# Patient Record
Sex: Female | Born: 1990
Health system: Southern US, Community
[De-identification: ages and names within clinical notes are randomized; demographics above are authoritative.]

## PROBLEM LIST (undated history)

## (undated) DIAGNOSIS — F32A Depression, unspecified: Secondary | ICD-10-CM

## (undated) DIAGNOSIS — B20 Human immunodeficiency virus [HIV] disease: Secondary | ICD-10-CM

## (undated) DIAGNOSIS — B1910 Unspecified viral hepatitis B without hepatic coma: Secondary | ICD-10-CM

## (undated) DIAGNOSIS — B1920 Unspecified viral hepatitis C without hepatic coma: Secondary | ICD-10-CM

## (undated) DIAGNOSIS — Z21 Asymptomatic human immunodeficiency virus [HIV] infection status: Secondary | ICD-10-CM

---

## 2019-06-17 ENCOUNTER — Observation Stay (HOSPITAL_COMMUNITY): Payer: Self-pay

## 2019-06-17 ENCOUNTER — Emergency Department (HOSPITAL_COMMUNITY): Payer: Self-pay

## 2019-06-17 ENCOUNTER — Encounter (HOSPITAL_COMMUNITY): Payer: Self-pay | Admitting: Emergency Medicine

## 2019-06-17 ENCOUNTER — Inpatient Hospital Stay (HOSPITAL_COMMUNITY)
Admission: EM | Admit: 2019-06-17 | Discharge: 2019-06-20 | DRG: 917 | Disposition: A | Discharge status: Home / Self Care | Payer: Self-pay | Attending: Internal Medicine | Admitting: Internal Medicine

## 2019-06-17 DIAGNOSIS — T43621A Poisoning by amphetamines, accidental (unintentional), initial encounter: Principal | ICD-10-CM | POA: Diagnosis present

## 2019-06-17 DIAGNOSIS — F1310 Sedative, hypnotic or anxiolytic abuse, uncomplicated: Secondary | ICD-10-CM | POA: Diagnosis present

## 2019-06-17 DIAGNOSIS — T50901A Poisoning by unspecified drugs, medicaments and biological substances, accidental (unintentional), initial encounter: Secondary | ICD-10-CM | POA: Diagnosis present

## 2019-06-17 DIAGNOSIS — Z21 Asymptomatic human immunodeficiency virus [HIV] infection status: Secondary | ICD-10-CM | POA: Diagnosis present

## 2019-06-17 DIAGNOSIS — Z885 Allergy status to narcotic agent status: Secondary | ICD-10-CM

## 2019-06-17 DIAGNOSIS — Z882 Allergy status to sulfonamides status: Secondary | ICD-10-CM

## 2019-06-17 DIAGNOSIS — F1910 Other psychoactive substance abuse, uncomplicated: Secondary | ICD-10-CM | POA: Diagnosis present

## 2019-06-17 DIAGNOSIS — B1920 Unspecified viral hepatitis C without hepatic coma: Secondary | ICD-10-CM | POA: Diagnosis present

## 2019-06-17 DIAGNOSIS — F419 Anxiety disorder, unspecified: Secondary | ICD-10-CM | POA: Diagnosis present

## 2019-06-17 DIAGNOSIS — R4182 Altered mental status, unspecified: Secondary | ICD-10-CM

## 2019-06-17 DIAGNOSIS — Z20822 Contact with and (suspected) exposure to covid-19: Secondary | ICD-10-CM | POA: Diagnosis present

## 2019-06-17 DIAGNOSIS — F1510 Other stimulant abuse, uncomplicated: Secondary | ICD-10-CM | POA: Diagnosis present

## 2019-06-17 DIAGNOSIS — R011 Cardiac murmur, unspecified: Secondary | ICD-10-CM

## 2019-06-17 DIAGNOSIS — G92 Toxic encephalopathy: Secondary | ICD-10-CM

## 2019-06-17 DIAGNOSIS — R509 Fever, unspecified: Secondary | ICD-10-CM | POA: Diagnosis present

## 2019-06-17 DIAGNOSIS — I38 Endocarditis, valve unspecified: Secondary | ICD-10-CM

## 2019-06-17 DIAGNOSIS — B20 Human immunodeficiency virus [HIV] disease: Secondary | ICD-10-CM | POA: Diagnosis present

## 2019-06-17 DIAGNOSIS — R6510 Systemic inflammatory response syndrome (SIRS) of non-infectious origin without acute organ dysfunction: Secondary | ICD-10-CM

## 2019-06-17 DIAGNOSIS — E876 Hypokalemia: Secondary | ICD-10-CM

## 2019-06-17 DIAGNOSIS — G928 Other toxic encephalopathy: Secondary | ICD-10-CM | POA: Diagnosis present

## 2019-06-17 DIAGNOSIS — B1910 Unspecified viral hepatitis B without hepatic coma: Secondary | ICD-10-CM

## 2019-06-17 LAB — CBC
HCT: 34.9 % — ABNORMAL LOW (ref 36.0–46.0)
Hemoglobin: 10.3 g/dL — ABNORMAL LOW (ref 12.0–15.0)
MCH: 22.9 pg — ABNORMAL LOW (ref 26.0–34.0)
MCHC: 29.5 g/dL — ABNORMAL LOW (ref 30.0–36.0)
MCV: 77.6 fL — ABNORMAL LOW (ref 80.0–100.0)
Platelets: 249 10*3/uL (ref 150–400)
RBC: 4.5 MIL/uL (ref 3.87–5.11)
RDW: 16.9 % — ABNORMAL HIGH (ref 11.5–15.5)
WBC: 6.3 10*3/uL (ref 4.0–10.5)
nRBC: 0 % (ref 0.0–0.2)

## 2019-06-17 LAB — CBC WITH DIFFERENTIAL/PLATELET
Abs Immature Granulocytes: 0.04 K/uL (ref 0.00–0.07)
Basophils Absolute: 0 K/uL (ref 0.0–0.1)
Basophils Relative: 0 %
Eosinophils Absolute: 0 K/uL (ref 0.0–0.5)
Eosinophils Relative: 0 %
HCT: 37.1 % (ref 36.0–46.0)
Hemoglobin: 11.1 g/dL — ABNORMAL LOW (ref 12.0–15.0)
Immature Granulocytes: 0 %
Lymphocytes Relative: 9 %
Lymphs Abs: 0.8 K/uL (ref 0.7–4.0)
MCH: 23 pg — ABNORMAL LOW (ref 26.0–34.0)
MCHC: 29.9 g/dL — ABNORMAL LOW (ref 30.0–36.0)
MCV: 76.8 fL — ABNORMAL LOW (ref 80.0–100.0)
Monocytes Absolute: 0.6 K/uL (ref 0.1–1.0)
Monocytes Relative: 6 %
Neutro Abs: 7.6 K/uL (ref 1.7–7.7)
Neutrophils Relative %: 85 %
Platelets: 305 K/uL (ref 150–400)
RBC: 4.83 MIL/uL (ref 3.87–5.11)
RDW: 16.6 % — ABNORMAL HIGH (ref 11.5–15.5)
WBC: 9.1 K/uL (ref 4.0–10.5)
nRBC: 0 % (ref 0.0–0.2)

## 2019-06-17 LAB — I-STAT BETA HCG BLOOD, ED (MC, WL, AP ONLY): I-stat hCG, quantitative: <5 m[IU]/mL (ref <5)

## 2019-06-17 LAB — URINALYSIS, ROUTINE W REFLEX MICROSCOPIC
Bilirubin Urine: NEGATIVE
Glucose, UA: NEGATIVE mg/dL
Ketones, ur: 5 mg/dL — AB
Leukocytes,Ua: NEGATIVE
Nitrite: NEGATIVE
Protein, ur: NEGATIVE mg/dL
Specific Gravity, Urine: 1.006 (ref 1.005–1.030)
pH: 6 (ref 5.0–8.0)

## 2019-06-17 LAB — COMPREHENSIVE METABOLIC PANEL WITH GFR
ALT: 28 U/L (ref 0–44)
AST: 37 U/L (ref 15–41)
Albumin: 3.8 g/dL (ref 3.5–5.0)
Alkaline Phosphatase: 43 U/L (ref 38–126)
Anion gap: 13 (ref 5–15)
BUN: 12 mg/dL (ref 6–20)
CO2: 22 mmol/L (ref 22–32)
Calcium: 8.7 mg/dL — ABNORMAL LOW (ref 8.9–10.3)
Chloride: 103 mmol/L (ref 98–111)
Creatinine, Ser: 1.02 mg/dL — ABNORMAL HIGH (ref 0.44–1.00)
GFR calc Af Amer: 38 mL/min — ABNORMAL LOW
GFR calc non Af Amer: 33 mL/min — ABNORMAL LOW
Glucose, Bld: 99 mg/dL (ref 70–99)
Potassium: 2.9 mmol/L — ABNORMAL LOW (ref 3.5–5.1)
Sodium: 138 mmol/L (ref 135–145)
Total Bilirubin: 1 mg/dL (ref 0.3–1.2)
Total Protein: 8.1 g/dL (ref 6.5–8.1)

## 2019-06-17 LAB — GLUCOSE, CSF: Glucose, CSF: 73 mg/dL — ABNORMAL HIGH (ref 40–70)

## 2019-06-17 LAB — ETHANOL: Alcohol, Ethyl (B): <10 mg/dL

## 2019-06-17 LAB — CREATININE, SERUM
Creatinine, Ser: 0.69 mg/dL (ref 0.44–1.00)
GFR calc Af Amer: >60 mL/min (ref >60)
GFR calc non Af Amer: 52 mL/min — ABNORMAL LOW (ref >60)

## 2019-06-17 LAB — ECHOCARDIOGRAM COMPLETE
Height: 62 in
Weight: 2116.42 [oz_av]

## 2019-06-17 LAB — CSF CELL COUNT WITH DIFFERENTIAL
RBC Count, CSF: 7 /mm3 — ABNORMAL HIGH
Tube #: 1
WBC, CSF: 4 /mm3 (ref 0–5)

## 2019-06-17 LAB — MAGNESIUM: Magnesium: 1.8 mg/dL (ref 1.7–2.4)

## 2019-06-17 LAB — RAPID URINE DRUG SCREEN, HOSP PERFORMED
Amphetamines: POSITIVE — AB
Barbiturates: NOT DETECTED
Benzodiazepines: POSITIVE — AB
Cocaine: NOT DETECTED
Opiates: NOT DETECTED
Tetrahydrocannabinol: NOT DETECTED

## 2019-06-17 LAB — PROTEIN, CSF: Total  Protein, CSF: 35 mg/dL (ref 15–45)

## 2019-06-17 LAB — SARS CORONAVIRUS 2 (TAT 6-24 HRS): SARS Coronavirus 2: NEGATIVE

## 2019-06-17 LAB — LACTIC ACID, PLASMA
Lactic Acid, Venous: 1.4 mmol/L (ref 0.5–1.9)
Lactic Acid, Venous: 1.2 mmol/L (ref 0.5–1.9)

## 2019-06-17 LAB — APTT: aPTT: 36 seconds (ref 24–36)

## 2019-06-17 LAB — TSH: TSH: 0.395 u[IU]/mL (ref 0.350–4.500)

## 2019-06-17 LAB — CK: Total CK: 460 U/L — ABNORMAL HIGH (ref 38–234)

## 2019-06-17 LAB — RAPID HIV SCREEN (HIV 1/2 AB+AG)
HIV 1/2 Antibodies: REACTIVE — AB
HIV-1 P24 Antigen - HIV24: NONREACTIVE

## 2019-06-17 LAB — VITAMIN B12: Vitamin B-12: 157 pg/mL — ABNORMAL LOW (ref 180–914)

## 2019-06-17 LAB — PROTIME-INR
INR: 1.3 — ABNORMAL HIGH (ref 0.8–1.2)
Prothrombin Time: 16 seconds — ABNORMAL HIGH (ref 11.4–15.2)

## 2019-06-17 MED ORDER — SODIUM CHLORIDE 0.9 % IV SOLN: 2.0000 g | Freq: Three times a day (TID) | INTRAVENOUS | Status: DC

## 2019-06-17 MED ORDER — ONDANSETRON HCL 4 MG PO TABS: 4.0000 mg | ORAL_TABLET | Freq: Four times a day (QID) | ORAL | Status: DC | PRN

## 2019-06-17 MED ORDER — KETAMINE HCL 10 MG/ML IJ SOLN
INTRAMUSCULAR | Status: AC | PRN
  Administered 2019-06-17: 60 mg via INTRAVENOUS

## 2019-06-17 MED ORDER — ONDANSETRON HCL 4 MG/2ML IJ SOLN
4.0000 mg | Freq: Four times a day (QID) | INTRAMUSCULAR | Status: DC | PRN
  Administered 2019-06-18 – 2019-06-19 (×2): 4 mg via INTRAVENOUS
  Filled 2019-06-17 (×2): qty 2

## 2019-06-17 MED ORDER — METRONIDAZOLE IN NACL 5-0.79 MG/ML-% IV SOLN
500.0000 mg | Freq: Once | INTRAVENOUS | Status: AC
  Administered 2019-06-17: 500 mg via INTRAVENOUS
  Filled 2019-06-17: qty 100

## 2019-06-17 MED ORDER — LIDOCAINE HCL 2 % IJ SOLN
20.0000 mL | Freq: Once | INTRAMUSCULAR | Status: AC
  Administered 2019-06-17: 400 mg
  Filled 2019-06-17: qty 20

## 2019-06-17 MED ORDER — LACTATED RINGERS IV BOLUS (SEPSIS)
1000.0000 mL | Freq: Once | INTRAVENOUS | Status: AC
  Administered 2019-06-17: 1000 mL via INTRAVENOUS

## 2019-06-17 MED ORDER — SODIUM CHLORIDE 0.9 % IV SOLN
2.0000 g | Freq: Once | INTRAVENOUS | Status: AC
  Administered 2019-06-17: 05:00:00 2 g via INTRAVENOUS
  Filled 2019-06-17: qty 2

## 2019-06-17 MED ORDER — ENOXAPARIN SODIUM 40 MG/0.4ML ~~LOC~~ SOLN
40.0000 mg | Freq: Every day | SUBCUTANEOUS | Status: DC
  Administered 2019-06-17 – 2019-06-19 (×3): 40 mg via SUBCUTANEOUS
  Filled 2019-06-17 (×4): qty 0.4

## 2019-06-17 MED ORDER — SODIUM CHLORIDE 0.9 % IV SOLN: 1.0000 mg | Freq: Once | INTRAVENOUS | Status: DC

## 2019-06-17 MED ORDER — VANCOMYCIN HCL IN DEXTROSE 1-5 GM/200ML-% IV SOLN
1000.0000 mg | Freq: Once | INTRAVENOUS | Status: AC
  Administered 2019-06-17: 1000 mg via INTRAVENOUS
  Filled 2019-06-17: qty 200

## 2019-06-17 MED ORDER — PROPOFOL 10 MG/ML IV BOLUS
INTRAVENOUS | Status: AC | PRN
  Administered 2019-06-17: 30 mg via INTRAVENOUS

## 2019-06-17 MED ORDER — MAGNESIUM SULFATE 2 GM/50ML IV SOLN
2.0000 g | Freq: Once | INTRAVENOUS | Status: AC
  Administered 2019-06-17: 2 g via INTRAVENOUS
  Filled 2019-06-17: qty 50

## 2019-06-17 MED ORDER — ACETAMINOPHEN 325 MG PO TABS: 650.0000 mg | ORAL_TABLET | Freq: Four times a day (QID) | ORAL | Status: DC | PRN

## 2019-06-17 MED ORDER — POTASSIUM CHLORIDE IN NACL 40-0.9 MEQ/L-% IV SOLN
INTRAVENOUS | Status: DC
  Administered 2019-06-17 – 2019-06-18 (×2): 125 mL/h via INTRAVENOUS
  Filled 2019-06-17 (×4): qty 1000

## 2019-06-17 MED ORDER — THIAMINE HCL 100 MG/ML IJ SOLN
100.0000 mg | Freq: Once | INTRAMUSCULAR | Status: AC
  Administered 2019-06-17: 100 mg via INTRAVENOUS

## 2019-06-17 MED ORDER — KETAMINE HCL 50 MG/5ML IJ SOSY
1.0000 mg/kg | PREFILLED_SYRINGE | Freq: Once | INTRAMUSCULAR | Status: AC
  Administered 2019-06-17: 60 mg via INTRAVENOUS
  Filled 2019-06-17: qty 10

## 2019-06-17 MED ORDER — LACTATED RINGERS IV SOLN: INTRAVENOUS | Status: DC

## 2019-06-17 MED ORDER — ZIPRASIDONE MESYLATE 20 MG IM SOLR
20.0000 mg | Freq: Once | INTRAMUSCULAR | Status: AC
  Administered 2019-06-17: 20 mg via INTRAMUSCULAR
  Filled 2019-06-17: qty 20

## 2019-06-17 MED ORDER — SODIUM CHLORIDE 0.9 % IV SOLN
2.0000 g | Freq: Two times a day (BID) | INTRAVENOUS | Status: DC
  Administered 2019-06-17 – 2019-06-19 (×5): 2 g via INTRAVENOUS
  Filled 2019-06-17: qty 2
  Filled 2019-06-17 (×4): qty 20
  Filled 2019-06-17: qty 2
  Filled 2019-06-17: qty 20

## 2019-06-17 MED ORDER — FOLIC ACID 5 MG/ML IJ SOLN
1.0000 mg | Freq: Every day | INTRAMUSCULAR | Status: DC
  Administered 2019-06-17 – 2019-06-20 (×4): 1 mg via INTRAVENOUS
  Filled 2019-06-17 (×5): qty 0.2

## 2019-06-17 MED ORDER — PROPOFOL 10 MG/ML IV BOLUS
0.5000 mg/kg | Freq: Once | INTRAVENOUS | Status: AC
  Administered 2019-06-17: 30 mg via INTRAVENOUS
  Filled 2019-06-17: qty 20

## 2019-06-17 MED ORDER — VANCOMYCIN HCL IN DEXTROSE 750-5 MG/150ML-% IV SOLN
750.0000 mg | Freq: Two times a day (BID) | INTRAVENOUS | Status: DC
  Administered 2019-06-18 – 2019-06-19 (×3): 750 mg via INTRAVENOUS
  Filled 2019-06-17 (×4): qty 150

## 2019-06-17 MED ORDER — ACETAMINOPHEN 650 MG RE SUPP: 650.0000 mg | Freq: Four times a day (QID) | RECTAL | Status: DC | PRN

## 2019-06-17 MED ORDER — VANCOMYCIN HCL IN DEXTROSE 1-5 GM/200ML-% IV SOLN: 1000.0000 mg | INTRAVENOUS | Status: DC

## 2019-06-17 NOTE — Progress Notes (Signed)
Pharmacy Antibiotic Note  Cindy Robertson is a 29 y.o. female admitted on 06/17/2019 with rule out sepsis/meningitis. Pharmacy has been consulted for Vancomycin dosing. Also on ceftriaxone per MD. S/p LP - findings not consistent with meningitis. SCr improved, 1.02>>0.69.  Plan: Change vancomycin to 750mg  IV q12h Ceftriaxone 2g IV q12h per MD - f/u reduce dose if meningitis r/o Monitor clinical progress, c/s, renal function F/u de-escalation plan/LOT, vancomycin levels as indicated    Height: 5\' 2"  (157.5 cm) Weight: 60 kg (132 lb 4.4 oz) IBW/kg (Calculated) : 50.1  Temp (24hrs), Avg:101.4 F (38.6 C), Min:101.4 F (38.6 C), Max:101.4 F (38.6 C)  Recent Labs  Lab 06/17/19 0408 06/17/19 0516 06/17/19 0917  WBC 9.1  --  6.3  CREATININE 1.02*  --  0.69  LATICACIDVEN 1.4 1.2  --     Estimated Creatinine Clearance: 76.2 mL/min (by C-G formula based on SCr of 0.69 mg/dL).    Not on File   06/19/19, PharmD, BCPS Please check AMION for all [facility] contact numbers Clinical Pharmacist 06/17/2019 11:22 AM

## 2019-06-17 NOTE — ED Notes (Signed)
Unable to perform portable chest x-ray at this time due to patient's agitation /restless and movement , attempted multiple times to access peripheral IV and collect blood specimen but failed due to patients movement /uncooperative/agitation and restlessness, EDP notified .

## 2019-06-17 NOTE — Progress Notes (Signed)
Pharmacy Antibiotic Note  Cindy Robertson is a 29 y.o. female admitted on 06/17/2019 with rule out sepsis.  Pharmacy has been consulted for Vancomycin/Cefepime dosing. WBC WNL. Renal function OK.   Plan: Vancomycin 1000 mg IV q24h >>Estimated AUC: 441 Cefepime 2g IV q8h Trend WBC, temp, renal function  F/U infectious work-up Drug levels as indicated   Height: 5\' 2"  (157.5 cm) Weight: 60 kg (132 lb 4.4 oz) IBW/kg (Calculated) : 50.1  Temp (24hrs), Avg:101.4 F (38.6 C), Min:101.4 F (38.6 C), Max:101.4 F (38.6 C)  Recent Labs  Lab 06/17/19 0408  WBC 9.1  CREATININE 1.02*  LATICACIDVEN 1.4    Estimated Creatinine Clearance: -2.3 mL/min (A) (by C-G formula based on SCr of 1.02 mg/dL (H)).    Not on File  06/19/19, PharmD, BCPS Clinical Pharmacist Phone: [phone]

## 2019-06-17 NOTE — ED Provider Notes (Signed)
[facility] EMERGENCY DEPARTMENT Provider Note   CSN: [id number] Arrival date & time: 06/17/19  6546     History Chief Complaint  Patient presents with  . Drug Problem    Cindy Robertson is a 29 y.o. female.  Patient brought to the emergency department by ambulance from a local hotel.  Police were called because the patient was extremely agitated and acting erratically.  EMS report that the patient was extremely agitated, diaphoretic, not redirectable.  They were unable to start an IV, administered Versed 5 mg IM in order to calm her down.  Before she was sedated she admitted to meth use but says she has not had any in 1 to 2 days.        History reviewed. No pertinent past medical history.  There are no problems to display for this patient.   History reviewed. No pertinent surgical history.   OB History   No obstetric history on file.     No family history on file.  Social History   Tobacco Use  . Smoking status: Unknown If Ever Smoked  Substance Use Topics  . Alcohol use: Not on file  . Drug use: Not on file    Home Medications Prior to Admission medications   Not on File    Allergies    Patient has no allergy information on record.  Review of Systems   Review of Systems  Unable to perform ROS: Mental status change    Physical Exam Updated Vital Signs BP 135/77   Pulse 92   Temp (!) 101.4 F (38.6 C) (Rectal)   Resp 14   Ht 5\' 2"  (1.575 m)   Wt 60 kg   SpO2 100%   BMI 24.19 kg/m   Physical Exam Vitals and nursing note reviewed.  Constitutional:      General: She is not in acute distress.    Appearance: Normal appearance. She is well-developed.  HENT:     Head: Normocephalic and atraumatic.     Right Ear: Hearing normal.     Left Ear: Hearing normal.     Nose: Nose normal.  Eyes:     Conjunctiva/sclera: Conjunctivae normal.     Pupils: Pupils are equal, round, and reactive to light.  Cardiovascular:     Rate and  Rhythm: Regular rhythm.     Heart sounds: S1 normal and S2 normal. No murmur. No friction rub. No gallop.   Pulmonary:     Effort: Pulmonary effort is normal. No respiratory distress.     Breath sounds: Normal breath sounds.  Chest:     Chest wall: No tenderness.  Abdominal:     General: Bowel sounds are normal.     Palpations: Abdomen is soft.     Tenderness: There is no abdominal tenderness. There is no guarding or rebound. Negative signs include Murphy's sign and McBurney's sign.     Hernia: No hernia is present.  Musculoskeletal:        General: Normal range of motion.     Cervical back: Normal range of motion and neck supple.  Skin:    General: Skin is warm and dry.     Findings: No rash.     Comments: Multiple scabs on face and neck, across abdomen  Neurological:     Mental Status: She is alert. She is disoriented.     GCS: GCS eye subscore is 4. GCS verbal subscore is 5. GCS motor subscore is 6.  Cranial Nerves: No cranial nerve deficit.     Sensory: No sensory deficit.     Coordination: Coordination normal.  Psychiatric:        Speech: Speech normal.     ED Results / Procedures / Treatments   Labs (all labs ordered are listed, but only abnormal results are displayed) Labs Reviewed  COMPREHENSIVE METABOLIC PANEL - Abnormal; Notable for the following components:      Result Value   Potassium 2.9 (*)    Creatinine, Ser 1.02 (*)    Calcium 8.7 (*)    GFR calc non Af Amer 33 (*)    GFR calc Af Amer 38 (*)    All other components within normal limits  CBC WITH DIFFERENTIAL/PLATELET - Abnormal; Notable for the following components:   Hemoglobin 11.1 (*)    MCV 76.8 (*)    MCH 23.0 (*)    MCHC 29.9 (*)    RDW 16.6 (*)    All other components within normal limits  PROTIME-INR - Abnormal; Notable for the following components:   Prothrombin Time 16.0 (*)    INR 1.3 (*)    All other components within normal limits  CK - Abnormal; Notable for the following  components:   Total CK 460 (*)    All other components within normal limits  CULTURE, BLOOD (ROUTINE X 2)  CULTURE, BLOOD (ROUTINE X 2)  URINE CULTURE  CSF CULTURE  GRAM STAIN  LACTIC ACID, PLASMA  LACTIC ACID, PLASMA  APTT  URINALYSIS, ROUTINE W REFLEX MICROSCOPIC  ETHANOL  RAPID URINE DRUG SCREEN, HOSP PERFORMED  VDRL, CSF  CSF CELL COUNT WITH DIFFERENTIAL  GLUCOSE, CSF  PROTEIN, CSF  HERPES SIMPLEX VIRUS(HSV) DNA BY PCR  I-STAT BETA HCG BLOOD, ED ([facility], [facility], [facility] ONLY)  I-STAT BETA HCG BLOOD, ED ([facility], [facility], [facility] ONLY)    EKG EKG Interpretation  Date/Time:  Tuesday June 17 2019 02:45:06 EDT Ventricular Rate:  117 PR Interval:    QRS Duration: 84 QT Interval:  306 QTC Calculation: 427 R Axis:   84 Text Interpretation: Sinus tachycardia Borderline right axis deviation Repolarization abnormality, prob rate related No previous tracing Confirmed by Gilda Crease [phone]) on 06/17/2019 2:50:07 AM   Radiology CT HEAD WO CONTRAST  Result Date: 06/17/2019 CLINICAL DATA:  Mental status change with unknown cause EXAM: CT HEAD WITHOUT CONTRAST TECHNIQUE: Contiguous axial images were obtained from the base of the skull through the vertex without intravenous contrast. COMPARISON:  None. FINDINGS: Brain: No evidence of acute infarction, hemorrhage, hydrocephalus, extra-axial collection or mass lesion/mass effect. Vascular: No hyperdense vessel or unexpected calcification. Skull: Normal. Negative for fracture or focal lesion. Sinuses/Orbits: No acute finding. Other: Partial coverage of the maxillary teeth shows multiple cavities. IMPRESSION: Negative CT of the brain. Electronically Signed   By: Marnee Spring M.D.   On: 06/17/2019 05:16   DG Chest Port 1 View  Result Date: 06/17/2019 CLINICAL DATA:  Fever EXAM: PORTABLE CHEST 1 VIEW COMPARISON:  None. FINDINGS: The heart size and mediastinal contours are within normal limits. Both lungs are clear. The visualized skeletal structures are  unremarkable. IMPRESSION: No active disease. Electronically Signed   By: Jasmine Pang M.D.   On: 06/17/2019 03:55    Procedures .Sedation  Date/Time: 06/17/2019 6:03 AM Performed by: Gilda Crease, MD Authorized by: Gilda Crease, MD   Consent:    Consent obtained:  Emergent situation Universal protocol:    Procedure explained and questions answered to patient or proxy's  satisfaction: yes     Relevant documents present and verified: yes     Test results available and properly labeled: yes     Imaging studies available: yes     Required blood products, implants, devices, and special equipment available: yes     Site/side marked: yes     Immediately prior to procedure a time out was called: yes     Patient identity confirmation method:  Arm band and hospital-assigned identification number Indications:    Procedure performed:  Lumbar puncture   Procedure necessitating sedation performed by:  Physician performing sedation Pre-sedation assessment:    Time since last food or drink:  ?   NPO status caution: unable to specify NPO status     ASA classification: class 1 - normal, healthy patient     Neck mobility: normal     Mouth opening:  3 or more finger widths   Thyromental distance:  3 finger widths   Mallampati score:  I - soft palate, uvula, fauces, pillars visible   Pre-sedation assessments completed and reviewed: airway patency, cardiovascular function, hydration status, mental status, nausea/vomiting, pain level, respiratory function and temperature   Immediate pre-procedure details:    Reassessment: Patient reassessed immediately prior to procedure     Reviewed: vital signs, relevant labs/tests and NPO status     Verified: bag valve mask available, emergency equipment available, intubation equipment available, IV patency confirmed, oxygen available and reversal medications available   Procedure details (see MAR for exact dosages):    Preoxygenation:  Nasal  cannula   Sedation:  Ketamine and propofol   Intended level of sedation: deep   Intra-procedure monitoring:  Blood pressure monitoring, continuous capnometry, frequent LOC assessments, frequent vital sign checks, continuous pulse oximetry and cardiac monitor   Intra-procedure events: none     Total Provider sedation time (minutes):  20 Post-procedure details:    Attendance: Constant attendance by certified staff until patient recovered     Recovery: Patient returned to pre-procedure baseline     Post-sedation assessments completed and reviewed: airway patency, cardiovascular function, hydration status, mental status, nausea/vomiting, pain level, respiratory function and temperature     Patient is stable for discharge or admission: yes     Patient tolerance:  Tolerated well, no immediate complications .Lumbar Puncture  Date/Time: 06/17/2019 6:06 AM Performed by: Gilda CreasePollina, Orla Jolliff J, MD Authorized by: Gilda CreasePollina, Karron Goens J, MD   Consent:    Consent obtained:  Emergent situation Universal protocol:    Procedure explained and questions answered to patient or proxy's satisfaction: yes     Relevant documents present and verified: yes     Test results available and properly labeled: yes     Imaging studies available: yes     Required blood products, implants, devices, and special equipment available: yes     Immediately prior to procedure a time out was called: yes     Site/side marked: yes     Patient identity confirmed:  Arm band and provided demographic data Pre-procedure details:    Procedure purpose:  Diagnostic   Preparation: Patient was prepped and draped in usual sterile fashion   Sedation:    Sedation type:  Deep Anesthesia (see MAR for exact dosages):    Anesthesia method:  Local infiltration   Local anesthetic:  Lidocaine 1% w/o epi Procedure details:    Lumbar space:  L4-L5 interspace   Patient position:  L lateral decubitus   Needle gauge:  20   Needle type:  Spinal  needle - Quincke tip   Needle length (in):  3.5   Ultrasound guidance: no     Number of attempts:  1   Opening pressure (cm H2O):  18   Fluid appearance:  Clear   Tubes of fluid:  4   Total volume (ml):  5 Post-procedure:    Puncture site:  Adhesive bandage applied and direct pressure applied   Patient tolerance of procedure:  Tolerated well, no immediate complications .Critical Care Performed by: Gilda Crease, MD Authorized by: Gilda Crease, MD   Critical care provider statement:    Critical care time (minutes):  40   Critical care time was exclusive of:  Separately billable procedures and treating other patients   Critical care was necessary to treat or prevent imminent or life-threatening deterioration of the following conditions:  CNS failure or compromise   Critical care was time spent personally by me on the following activities:  Ordering and performing treatments and interventions, ordering and review of laboratory studies, discussions with consultants, ordering and review of radiographic studies, pulse oximetry, evaluation of patient's response to treatment, re-evaluation of patient's condition, review of old charts and examination of patient   I assumed direction of critical care for this patient from another provider in my specialty: no     (including critical care time)  Medications Ordered in ED Medications  vancomycin (VANCOCIN) IVPB 1000 mg/200 mL premix (has no administration in time range)  ceFEPIme (MAXIPIME) 2 g in sodium chloride 0.9 % 100 mL IVPB (has no administration in time range)  lactated ringers bolus 1,000 mL (0 mLs Intravenous Stopped 06/17/19 0525)    And  lactated ringers bolus 1,000 mL (0 mLs Intravenous Stopped 06/17/19 0610)  ceFEPIme (MAXIPIME) 2 g in sodium chloride 0.9 % 100 mL IVPB (0 g Intravenous Stopped 06/17/19 0500)  metroNIDAZOLE (FLAGYL) IVPB 500 mg (0 mg Intravenous Stopped 06/17/19 0524)  vancomycin (VANCOCIN) IVPB 1000  mg/200 mL premix (0 mg Intravenous Stopped 06/17/19 0524)  ziprasidone (GEODON) injection 20 mg (20 mg Intramuscular Given 06/17/19 0340)  lidocaine (XYLOCAINE) 2 % (with pres) injection 400 mg (400 mg Infiltration Given 06/17/19 0504)  ketamine 50 mg in normal saline 5 mL (10 mg/mL) syringe (60 mg Intravenous Given 06/17/19 0547)  propofol (DIPRIVAN) 10 mg/mL bolus/IV push 30 mg (30 mg Intravenous Given 06/17/19 0548)  propofol (DIPRIVAN) 10 mg/mL bolus/IV push (30 mg Intravenous Given 06/17/19 0547)  ketamine (KETALAR) injection (60 mg Intravenous Given 06/17/19 0548)    ED Course  I have reviewed the triage vital signs and the nursing notes.  Pertinent labs & imaging results that were available during my care of the patient were reviewed by me and considered in my medical decision making (see chart for details).    MDM Rules/Calculators/A&P                      Patient presents to the emergency department by EMS for severe agitation.  She was found outside a [facility] by EMS.  She had apparently been in the lobby creating havoc by yelling and being aggressive.  They removed her from the lobby to the outside where EMS found her.  They had difficulty evaluating her because of her agitation, was given Versed 5 mg IM and she did become somewhat more calm.  She did admit to them that she uses methamphetamine but says she had not used it in a couple of days.  By the time she arrived  in the ER she was sedated from Versed and did not answer any questions.  Initial work-up was difficult.  She pulled out multiple IVs.  She was not redirectable, became extremely agitated, yelling and incoherent when touched.  She was therefore given Geodon 20 mg IM at which point the work-up was facilitated.  Patient was found to be febrile at arrival.  This was considered possibly secondary to her level of agitation prior to arrival, however she does have multiple skin lesions and there are some presumptive track marks on  her arm, is therefore at risk for significant infection.  She was started on sepsis protocol treatment with IV fluids and broad-spectrum antibiotics.  Work-up did not reveal obvious source for infection.  Suspect that her altered mental status is all secondary to drug use but as already mentioned she has significant risk for infection.  Lumbar puncture therefore performed for further evaluation.  Studies are pending.  Urinalysis has now been obtained while she was sedated for the lumbar puncture procedure, are also pending.  Will admit to the hospitalist for further management.   Final Clinical Impression(s) / ED Diagnoses Final diagnoses:  Altered mental status, unspecified altered mental status type    Rx / DC Orders ED Discharge Orders    None       Brenly Trawick, Gwenyth Allegra, MD 06/17/19 [phone]

## 2019-06-17 NOTE — Progress Notes (Signed)
Progress Note    Cindy Robertson  BOF:[id number] DOB: 03/06/1874  DOA: 06/17/2019 PCP: No primary care provider on file.    Brief Narrative:    Admitted after midnight, please see H&P for full details:  [name] found outside local hotel by police due to observed extreme agitation and odd behavior.    EMS was called and brought the patient into [facility] emergency department for evaluation.  Patient is an extremely poor historian due to extreme agitation and altered mental status.  Upon evaluation in the emergency department patient continued erratic, agitated behavior and did not cooperate with commands or plan of care.  Patient was found to be in SIRS with tachycardia, tachypnea and fever of 101.90F.  Throughout the emergency department stay patient required several sedative agents including Geodon, propofol and ketamine administration.  Patient underwent thorough work-up to identify cause of patient's significant encephalopathy including CT imaging of the head, assess, hepatic function panel, LFT, ethanol level, toxicology screen and lumbar puncture.  Assessment/Plan:   Principal Problem:   Toxic metabolic encephalopathy Active Problems:   SIRS (systemic inflammatory response syndrome) (HCC)   Hypokalemia    Toxic metabolic encephalopathy with SIRS/ fever with unclear source -Patient brought in in a state of extreme anxiety with multiple SIRS criteria concerning for an infectious etiology -Lumbar puncture has already been performed in the emergency department: Culture and fluid studies not consistent with meningitis -Empirically treating patient with ceftriaxone and vancomycin -Chest x-ray reveals no evidence of pneumonia - Covid PCR testing pending -Considering numerous lesions over the extremities concerning for drug-related track marks with concurrent systolic murmur, infective endocarditis is a consideration. -Obtaining TTE for now and if index of  suspicion for infective endocarditis is high enough we will later consider TEE. -CT imaging of the head negative, urinalysis unremarkable, hepatic function panel unremarkable, urine toxicology screen is positive for benzos and amphetamines -TSH, vitamin B12, HIV -Due to the administration of multiple sedatives, will admit patient to stepdown unit for continued close clinical monitoring of oxygenation and airway: Pending improvement in the ER may be able to downgrade to cardiac telemetry -We will keep n.p.o. until mentation improves   Hypokalemia -Replacing with intravenous potassium chloride -Magnesium Pending   TOC consult as patient is unidentified still.  Patient will wake up briefly and state her name is Cindy Robertson with a date of birth of 10/15/17/1982 or 50 speech is impaired  Family Communication/Anticipated D/C date and plan/Code Status   DVT prophylaxis: Lovenox ordered. Code Status: Full Code.  Family Communication: Patient is a Cindy Robertson so unable to contact family as we do not know patient is Disposition Plan: change to inpatient   Dispo: The patient is from: unknown              Anticipated d/c is to: most likely home              Anticipated d/c date is: 3 days              Patient currently is not medically stable to d/c.         Medical Consultants:    None.     Subjective:   Admitted after midnight, please see H&P Minimally responsive but will wake up and mumble words  Objective:    Vitals:   06/17/19 0615 06/17/19 0630 06/17/19 0645 06/17/19 0700  BP: 136/76 136/85 138/84 129/79  Pulse: 89 88 87 85  Resp: 15 13 14 14   Temp:  TempSrc:      SpO2: 100% 100% 99% 98%  Weight:      Height:       No intake or output data in the 24 hours ending 06/17/19 0900 Filed Weights   06/17/19 0244  Weight: 60 kg     Data Reviewed:   I have personally reviewed following labs and imaging studies:  Labs: Labs show the following:     Basic Metabolic Panel: Recent Labs  Lab 06/17/19 0408  NA 138  K 2.9*  CL 103  CO2 22  GLUCOSE 99  BUN 12  CREATININE 1.02*  CALCIUM 8.7*   GFR Estimated Creatinine Clearance: -2.3 mL/min (A) (by C-G formula based on SCr of 1.02 mg/dL (H)). Liver Function Tests: Recent Labs  Lab 06/17/19 0408  AST 37  ALT 28  ALKPHOS 43  BILITOT 1.0  PROT 8.1  ALBUMIN 3.8   No results for input(s): LIPASE, AMYLASE in the last 168 hours. No results for input(s): AMMONIA in the last 168 hours. Coagulation profile Recent Labs  Lab 06/17/19 0408  INR 1.3*    CBC: Recent Labs  Lab 06/17/19 0408  WBC 9.1  NEUTROABS 7.6  HGB 11.1*  HCT 37.1  MCV 76.8*  PLT 305   Cardiac Enzymes: Recent Labs  Lab 06/17/19 0408  CKTOTAL 460*   BNP (last 3 results) No results for input(s): PROBNP in the last 8760 hours. CBG: No results for input(s): GLUCAP in the last 168 hours. D-Dimer: No results for input(s): DDIMER in the last 72 hours. Hgb A1c: No results for input(s): HGBA1C in the last 72 hours. Lipid Profile: No results for input(s): CHOL, HDL, LDLCALC, TRIG, CHOLHDL, LDLDIRECT in the last 72 hours. Thyroid function studies: No results for input(s): TSH, T4TOTAL, T3FREE, THYROIDAB in the last 72 hours.  Invalid input(s): FREET3 Anemia work up: No results for input(s): VITAMINB12, FOLATE, FERRITIN, TIBC, IRON, RETICCTPCT in the last 72 hours. Sepsis Labs: Recent Labs  Lab 06/17/19 0408 06/17/19 0516  WBC 9.1  --   LATICACIDVEN 1.4 1.2    Microbiology Recent Results (from the past 240 hour(s))  Blood Culture (routine x 2)     Status: None (Preliminary result)   Collection Time: 06/17/19  4:10 AM   Specimen: BLOOD LEFT ARM  Result Value Ref Range Status   Specimen Description BLOOD LEFT ARM  Final   Special Requests   Final    BOTTLES DRAWN AEROBIC AND ANAEROBIC Blood Culture results may not be optimal due to an excessive volume of blood received in culture bottles    Culture   Final    NO GROWTH < 12 HOURS Performed at [facility], [address], [place], [place] [address]    Report Status PENDING  Incomplete  Blood Culture (routine x 2)     Status: None (Preliminary result)   Collection Time: 06/17/19  5:17 AM   Specimen: BLOOD RIGHT ARM  Result Value Ref Range Status   Specimen Description BLOOD RIGHT ARM  Final   Special Requests   Final    BOTTLES DRAWN AEROBIC AND ANAEROBIC Blood Culture adequate volume   Culture   Final    NO GROWTH < 12 HOURS Performed at [facility], [address], [place], [place] [address]    Report Status PENDING  Incomplete  CSF culture     Status: None (Preliminary result)   Collection Time: 06/17/19  6:09 AM   Specimen: Back; Cerebrospinal Fluid  Result  Value Ref Range Status   Specimen Description BACK  Final   Special Requests NONE  Final   Gram Stain   Final    CYTOSPIN SMEAR NO WBC SEEN NO ORGANISMS SEEN Performed at [facility], [address], [place], [place] [address]    Culture PENDING  Incomplete   Report Status PENDING  Incomplete    Procedures and diagnostic studies:  CT HEAD WO CONTRAST  Result Date: 06/17/2019 CLINICAL DATA:  Mental status change with unknown cause EXAM: CT HEAD WITHOUT CONTRAST TECHNIQUE: Contiguous axial images were obtained from the base of the skull through the vertex without intravenous contrast. COMPARISON:  None. FINDINGS: Brain: No evidence of acute infarction, hemorrhage, hydrocephalus, extra-axial collection or mass lesion/mass effect. Vascular: No hyperdense vessel or unexpected calcification. Skull: Normal. Negative for fracture or focal lesion. Sinuses/Orbits: No acute finding. Other: Partial coverage of the maxillary teeth shows multiple cavities. IMPRESSION: Negative CT of the brain. Electronically Signed   By: Marnee Spring M.D.   On: 06/17/2019 05:16   DG Chest Port 1 View  Result Date: 06/17/2019 CLINICAL DATA:  Fever EXAM: PORTABLE  CHEST 1 VIEW COMPARISON:  None. FINDINGS: The heart size and mediastinal contours are within normal limits. Both lungs are clear. The visualized skeletal structures are unremarkable. IMPRESSION: No active disease. Electronically Signed   By: Jasmine Pang M.D.   On: 06/17/2019 03:55    Medications:   . enoxaparin (LOVENOX) injection  40 mg Subcutaneous Daily  . folic acid  1 mg Intravenous Daily  . thiamine injection  100 mg Intravenous Once   Continuous Infusions: . 0.9 % NaCl with KCl 40 mEq / L 125 mL/hr (06/17/19 0801)  . cefTRIAXone (ROCEPHIN)  IV    . vancomycin       LOS: 0 days   Joseph Art  [facility]   How to contact the [facility] Attending or Consulting provider 7A - 7P or covering provider during after hours 7P -7A, for this patient?  1. Check the care team in [facility] and look for a) attending/consulting [facility] provider listed and b) the [facility] team listed 2. Log into [email or web address] and use [place] universal password to access. If you do not have the password, please contact the hospital operator. 3. Locate the [facility] provider you are looking for under [facility] and page to a number that you can be directly reached. 4. If you still have difficulty reaching the provider, please page the [facility] (Director on Call) for the Hospitalists listed on amion for assistance.  06/17/2019, 9:00 AM

## 2019-06-17 NOTE — H&P (Signed)
History and Physical    Cindy Robertson UXL:[id number] DOB: 03/06/1874 DOA: 06/17/2019  PCP: No primary care provider on file.  Patient coming from: Home (found outside hotel)   Chief Complaint:  Chief Complaint  Patient presents with  . Drug Problem     HPI:    [name] found outside local hotel by police due to observed extreme agitation and odd behavior.    EMS was called and brought the patient into [facility] emergency department for evaluation.  Patient is an extremely poor historian due to extreme agitation and altered mental status.  Upon evaluation in the emergency department patient continued erratic, agitated behavior and did not cooperate with commands or plan of care.  Patient was found to be in SIRS with tachycardia, tachypnea and fever of 101.51F.  Throughout the emergency department stay patient required several sedative agents including Geodon, propofol and ketamine administration.  Patient underwent thorough work-up to identify cause of patient's significant encephalopathy including CT imaging of the head, assess, hepatic function panel, LFT, ethanol level, toxicology screen and lumbar puncture.  The hospitalist group was then called to assess the patient for admission to the hospital.   Review of Systems: Unable to perform review of systems or history due to severe lethargy and obtundation upon my evaluation of the patient.   History reviewed. No pertinent past medical history.  History reviewed. No pertinent surgical history.   has no history on file for tobacco, alcohol, and drug.  Not on File  Family History  Family history unknown: Yes     Prior to Admission medications   Not on File    Physical Exam: Vitals:   06/17/19 0600 06/17/19 0615 06/17/19 0630 06/17/19 0645  BP: 135/77 136/76 136/85 138/84  Pulse: 92 89 88 87  Resp: 14 15 13 14   Temp:      TempSrc:      SpO2: 100% 100% 100% 99%  Weight:      Height:         Constitutional: Severely lethargic and not responsive to verbal stimuli.  Patient not in acute distress. Skin: Numerous excoriations and shallow ulcerations noted over all extremities in various stages of healing.  Poor skin turgor noted.   Eyes: Pupils are equally reactive to light.  No evidence of scleral icterus or conjunctival pallor.  ENMT: Mucous membranes are moist. Posterior pharynx clear of any exudate or lesions. Normal dentition.   Neck: normal, supple, no masses, no thyromegaly Respiratory: clear to auscultation bilaterally, no wheezing no org, no crackles. Normal respiratory effort. No accessory muscle use.  Cardiovascular: 2 out of 6 systolic murmur noted, best over the aortic valve.  Regular rate and rhythm, n No extremity edema. 2+ pedal pulses. No carotid bruits.  Back:   Nontender without crepitus or deformity. Abdomen: Abdomen is soft and nontender.  No evidence of intra-abdominal masses.  Positive bowel sounds noted in all quadrants.   Musculoskeletal: No joint deformity upper and lower extremities. Good ROM, no contractures. Normal muscle tone.  Neurologic: Extremely lethargic.  Responsive only to painful stimuli.  Patient does seem to spontaneously move extremities.  Patient is currently not following commands.   Psychiatric: Unable to assess due to severe lethargy and obtundation  Labs on Admission: I have personally reviewed following labs and imaging studies -   CBC: Recent Labs  Lab 06/17/19 0408  WBC 9.1  NEUTROABS 7.6  HGB 11.1*  HCT 37.1  MCV 76.8*  PLT 536   Basic Metabolic  Panel: Recent Labs  Lab 06/17/19 0408  NA 138  K 2.9*  CL 103  CO2 22  GLUCOSE 99  BUN 12  CREATININE 1.02*  CALCIUM 8.7*   GFR: Estimated Creatinine Clearance: -2.3 mL/min (A) (by C-G formula based on SCr of 1.02 mg/dL (H)). Liver Function Tests: Recent Labs  Lab 06/17/19 0408  AST 37  ALT 28  ALKPHOS 43  BILITOT 1.0  PROT 8.1  ALBUMIN 3.8   No results for  input(s): LIPASE, AMYLASE in the last 168 hours. No results for input(s): AMMONIA in the last 168 hours. Coagulation Profile: Recent Labs  Lab 06/17/19 0408  INR 1.3*   Cardiac Enzymes: Recent Labs  Lab 06/17/19 0408  CKTOTAL 460*   BNP (last 3 results) No results for input(s): PROBNP in the last 8760 hours. HbA1C: No results for input(s): HGBA1C in the last 72 hours. CBG: No results for input(s): GLUCAP in the last 168 hours. Lipid Profile: No results for input(s): CHOL, HDL, LDLCALC, TRIG, CHOLHDL, LDLDIRECT in the last 72 hours. Thyroid Function Tests: No results for input(s): TSH, T4TOTAL, FREET4, T3FREE, THYROIDAB in the last 72 hours. Anemia Panel: No results for input(s): VITAMINB12, FOLATE, FERRITIN, TIBC, IRON, RETICCTPCT in the last 72 hours. Urine analysis: No results found for: COLORURINE, APPEARANCEUR, LABSPEC, [place], GLUCOSEU, [place], [place], [place], PROTEINUR, UROBILINOGEN, NITRITE, LEUKOCYTESUR  Radiological Exams on Admission personally reviewed: CT HEAD WO CONTRAST  Result Date: 06/17/2019 CLINICAL DATA:  Mental status change with unknown cause EXAM: CT HEAD WITHOUT CONTRAST TECHNIQUE: Contiguous axial images were obtained from the base of the skull through the vertex without intravenous contrast. COMPARISON:  None. FINDINGS: Brain: No evidence of acute infarction, hemorrhage, hydrocephalus, extra-axial collection or mass lesion/mass effect. Vascular: No hyperdense vessel or unexpected calcification. Skull: Normal. Negative for fracture or focal lesion. Sinuses/Orbits: No acute finding. Other: Partial coverage of the maxillary teeth shows multiple cavities. IMPRESSION: Negative CT of the brain. Electronically Signed   By: Monte Fantasia M.D.   On: 06/17/2019 05:16   DG Chest Port 1 View  Result Date: 06/17/2019 CLINICAL DATA:  Fever EXAM: PORTABLE CHEST 1 VIEW COMPARISON:  None. FINDINGS: The heart size and mediastinal contours are within normal limits.  Both lungs are clear. The visualized skeletal structures are unremarkable. IMPRESSION: No active disease. Electronically Signed   By: Donavan Foil M.D.   On: 06/17/2019 03:55    EKG: Personally reviewed.  Rhythm is sinus tachycardia with heart rate of 117 with evidence of early repolarization abnormalities.    Assessment/Plan Principal Problem:   Toxic metabolic encephalopathy   Patient brought in in a state of extreme anxiety with multiple SIRS criteria concerning for an infectious etiology  Lumbar puncture has already been performed in the emergency department, work-up is pending for bacterial meningitis  Empirically treating patient with ceftriaxone and vancomycin until cell count and Gram stain of CSF are back.  Additionally CT imaging of the head, urinalysis, hepatic function panel, urine toxicology screen are pending  TSH, vitamin B12 additionally ordered  Patient is extremely lethargic at this time, likely secondary to multiple sedative agents provided in the emergency department to proceed with plan of care.  Due to the administration of multiple sedatives, will admit patient to stepdown unit for continued close clinical monitoring of oxygenation and airway.  We will also monitor patient on telemetry  We will keep n.p.o. until mentation improves   Active Problems:   SIRS (systemic inflammatory response syndrome) (HCC)  Patient initially presented with  tachycardia, tachypnea and fever  Blood cultures, urinalysis and urine culture have been ordered  Chest x-ray reveals no evidence of pneumonia  Obtaining Covid PCR testing  Considering numerous lesions over the extremities concerning for drug-related track marks with concurrent systolic murmur, infective endocarditis is a consideration. -Obtaining TTE for now and if index of suspicion for infective endocarditis is high enough we will later consider TEE.  Empirically treating for possible underlying meningitis until CSF  cell count and Gram stain have resulted  Hydrating patient aggressively with intravenous isotonic fluids  Hypokalemia   Substantial hypokalemia  Replacing with intravenous potassium chloride  Magnesium Pending  Code Status:  Full code Family Communication: Deferred  Disposition Plan: Patient is anticipated to be discharged to Home once patient has met maximum benefit from current hospitalization.   Consults called: None  Admission status: Patient will be admitted to Observation and is anticipated to remain in the hospital for less than 2 midnights.   Vernelle Emerald MD [facility] Pager [phone]  If 7PM-7AM, please contact night-coverage [email or web address] Use universal [place] password for that web site. If you do not have the password, please call the hospital operator.  06/17/2019, 6:58 AM

## 2019-06-17 NOTE — ED Triage Notes (Addendum)
Patient arrived with EMS from a local motel's parking lot , possible illicit drug use , EMS gave Versed 5 mg IM for patient's agitation / restlessness and non compliance at scene  , presents multiple old skin abrasions at arms and neck , needle track marks and skin lesions at legs and arms .

## 2019-06-17 NOTE — ED Notes (Signed)
Report given to 2W

## 2019-06-17 NOTE — Progress Notes (Signed)
  Echocardiogram 2D Echocardiogram has been performed.  Cindy Robertson 06/17/2019, 10:58 AM

## 2019-06-18 DIAGNOSIS — F1910 Other psychoactive substance abuse, uncomplicated: Secondary | ICD-10-CM

## 2019-06-18 DIAGNOSIS — Z21 Asymptomatic human immunodeficiency virus [HIV] infection status: Secondary | ICD-10-CM | POA: Diagnosis present

## 2019-06-18 DIAGNOSIS — B20 Human immunodeficiency virus [HIV] disease: Secondary | ICD-10-CM | POA: Diagnosis present

## 2019-06-18 DIAGNOSIS — R4182 Altered mental status, unspecified: Secondary | ICD-10-CM

## 2019-06-18 LAB — HSV DNA BY PCR (REFERENCE LAB)
HSV 1 DNA: NEGATIVE
HSV 2 DNA: NEGATIVE

## 2019-06-18 LAB — COMPREHENSIVE METABOLIC PANEL
ALT: 22 U/L (ref 0–44)
AST: 28 U/L (ref 15–41)
Albumin: 2.4 g/dL — ABNORMAL LOW (ref 3.5–5.0)
Alkaline Phosphatase: 32 U/L — ABNORMAL LOW (ref 38–126)
Anion gap: 6 (ref 5–15)
BUN: 7 mg/dL (ref 6–20)
CO2: 23 mmol/L (ref 22–32)
Calcium: 7.7 mg/dL — ABNORMAL LOW (ref 8.9–10.3)
Chloride: 108 mmol/L (ref 98–111)
Creatinine, Ser: 0.64 mg/dL (ref 0.44–1.00)
GFR calc Af Amer: >60 mL/min (ref >60)
GFR calc non Af Amer: >60 mL/min (ref >60)
Glucose, Bld: 125 mg/dL — ABNORMAL HIGH (ref 70–99)
Potassium: 4.3 mmol/L (ref 3.5–5.1)
Sodium: 137 mmol/L (ref 135–145)
Total Bilirubin: 0.6 mg/dL (ref 0.3–1.2)
Total Protein: 5.8 g/dL — ABNORMAL LOW (ref 6.5–8.1)

## 2019-06-18 LAB — CBC WITH DIFFERENTIAL/PLATELET
Abs Immature Granulocytes: 0.01 10*3/uL (ref 0.00–0.07)
Basophils Absolute: 0 10*3/uL (ref 0.0–0.1)
Basophils Relative: 1 %
Eosinophils Absolute: 0.2 10*3/uL (ref 0.0–0.5)
Eosinophils Relative: 5 %
HCT: 34.5 % — ABNORMAL LOW (ref 36.0–46.0)
Hemoglobin: 10.3 g/dL — ABNORMAL LOW (ref 12.0–15.0)
Immature Granulocytes: 0 %
Lymphocytes Relative: 31 %
Lymphs Abs: 1 10*3/uL (ref 0.7–4.0)
MCH: 22.8 pg — ABNORMAL LOW (ref 26.0–34.0)
MCHC: 29.9 g/dL — ABNORMAL LOW (ref 30.0–36.0)
MCV: 76.5 fL — ABNORMAL LOW (ref 80.0–100.0)
Monocytes Absolute: 0.5 10*3/uL (ref 0.1–1.0)
Monocytes Relative: 14 %
Neutro Abs: 1.6 10*3/uL — ABNORMAL LOW (ref 1.7–7.7)
Neutrophils Relative %: 49 %
Platelets: 200 10*3/uL (ref 150–400)
RBC: 4.51 MIL/uL (ref 3.87–5.11)
RDW: 17.2 % — ABNORMAL HIGH (ref 11.5–15.5)
WBC: 3.3 10*3/uL — ABNORMAL LOW (ref 4.0–10.5)
nRBC: 0 % (ref 0.0–0.2)

## 2019-06-18 LAB — HIV-1/2 AB - DIFFERENTIATION
HIV 1 Ab: POSITIVE
HIV 2 Ab: NEGATIVE

## 2019-06-18 LAB — VDRL, CSF: VDRL Quant, CSF: NONREACTIVE

## 2019-06-18 LAB — URINE CULTURE: Culture: NO GROWTH

## 2019-06-18 LAB — HIV-1 RNA QUANT-NO REFLEX-BLD
HIV 1 RNA Quant: 165000 copies/mL
LOG10 HIV-1 RNA: 5.217 log10copy/mL

## 2019-06-18 LAB — MAGNESIUM: Magnesium: 1.8 mg/dL (ref 1.7–2.4)

## 2019-06-18 NOTE — TOC Initial Note (Addendum)
Transition of Care [facility]) - Initial/Assessment Note    Patient Details  Name: Cindy Robertson MRN: [id number] Date of Birth: 10/15/1980  [facility]) CM/SW Contact:    Beckie Busing, RN Phone Number: 06/18/2019, 11:18 AM  Clinical Narrative:                 CM at bedside to offer patient resources for substance abuse. Resources given for inpatient and outpatient services. Patient states that she does not have a PCP but declines to allow CM to set up an appointment at this time. Patient states that she does not want any services at this time but will accept resources for substance abuse. Will continue to follow for any further needs.   Expected Discharge Plan: Home/Self Care Barriers to Discharge: Continued Medical Work up   Patient Goals and CMS Choice        Expected Discharge Plan and Services Expected Discharge Plan: Home/Self Care                                              Prior Living Arrangements/Services   Lives with:: Self(patient declined to answer question) Patient language and need for interpreter reviewed:: Yes        Need for Family Participation in Patient Care: No (Comment) Care giver support system in place?: Yes (comment)   Criminal Activity/Legal Involvement Pertinent to Current Situation/Hospitalization: No - Comment as needed  Activities of Daily Living      Permission Sought/Granted                  Emotional Assessment Appearance:: Appears stated age Attitude/Demeanor/Rapport: Guarded Affect (typically observed): Flat Orientation: : Oriented to Self, Oriented to Place, Oriented to  Time, Oriented to Situation Alcohol / Substance Use: Alcohol Use, Illicit Drugs Psych Involvement: No (comment)  Admission diagnosis:  Overdose [T50.901A] Toxic metabolic encephalopathy [G92] Altered mental status, unspecified altered mental status type [R41.82] Patient Active Problem List   Diagnosis Date Noted  . Toxic  metabolic encephalopathy 06/17/2019  . SIRS (systemic inflammatory response syndrome) (HCC) 06/17/2019  . Hypokalemia 06/17/2019  . Overdose 06/17/2019   PCP:  Patient, No Pcp Per Pharmacy:   [id number] - [place], [place] - [address] AT [facility] [address] [place] [id number] Phone: [phone] Fax: [phone]     Social Determinants of Health (SDOH) Interventions    Readmission Risk Interventions No flowsheet data found.

## 2019-06-18 NOTE — Progress Notes (Addendum)
Called due to bradycardia. RN reports HR dipped to 30s. Patient seen, EKG reviewed: SR, rate 46, peaked T-waves.   BP is stable and patient asymptomatic. She is not on any culprit medications, TSH was normal yesterday, and she is not hypoxic. Plan to stop KCl fluids, check stat chemistries, continue cardiac monitoring.   ADDENDUM: Potassium normal. Mag low and will be replaced.

## 2019-06-18 NOTE — Progress Notes (Addendum)
PROGRESS NOTE    Cindy Robertson  GGY:[id number] DOB: 02-19-1991 DOA: 06/17/2019 PCP: Patient, No Pcp Per     Brief Narrative:  29 yo WF PMHx  PMHx IV drug use, methyl amphetamine use, found outside local hotel by police due to observed extreme agitation and odd behavior.    EMS was called and brought the patient into [facility] emergency department for evaluation.  Patient is an extremely poor historian due to extreme agitation and altered mental status.  Upon evaluation in the emergency department patient continued erratic, agitated behavior and did not cooperate with commands or plan of care.  Patient was found to be in SIRS with tachycardia, tachypnea and fever of 101.23F.  Throughout the emergency department stay patient required several sedative agents including Geodon, propofol and ketamine administration.  Patient underwent thorough work-up to identify cause of patient's significant encephalopathy including CT imaging of the head, assess, hepatic function panel, LFT, ethanol level, toxicology screen and lumbar puncture.  The hospitalist group was then called to assess the patient for admission to the hospital.    Subjective: A/O x4, admits to drug use.  States has not been sexually active in some time as was in prison 2018.  Negative CP, negative S OB.   Assessment & Plan:   Principal Problem:   Toxic metabolic encephalopathy Active Problems:   SIRS (systemic inflammatory response syndrome) (HCC)   Hypokalemia   Overdose   HIV (human immunodeficiency virus infection) (HCC)   Drug abuse (HCC)  Toxic metabolic encephalopathy with SIRS/Meningitis? -Most likely secondary to patient's methamphetamine use. -Resolved -CSF NOT CONSISTENT with meningitis however given patient's immunosuppressed state we will continue antibiotics until ID sees patient.  Fever with unclear source -See metabolic encephalopathy -No findings on echocardiogram to suggest endocarditis.  TEE  not required.  HIV positive -Discussed case with Dr. Staci Righter, ID.  Was already aware of positive finding.  ID will officially see patient in the A.m.  Hypokalemia -Resolved  Drug abuse -4/13 urine tox screen positive amphetamines and benzodiazepine -4/13 EtOH negative -4/14 LCSW consulted for drug abuse resources  Hypocalcemia   Goals of care -4/14 LCSW consult; drug abuse resources, patient without insurance now HIV positive will need help with medication, obtaining insurance to pay for medication.      DVT prophylaxis: Lovenox Code Status: Full Family Communication: 4/14 mother at bedside for discussion of plan of care answered all questions Disposition Plan:  1.  Where the patient is from 2.  Anticipated d/c place. 3.  Barriers to d/c new HIV patient per ID guidelines   Consultants:  ID   Procedures/Significant Events:  4/13 Echocardiogram;Left Ventricle: Left ventricular ejection fraction, by estimation, is 55  to 60%. The left ventricle has normal function. The left ventricle has no  regional wall motion abnormalities. The left ventricular internal cavity  size was normal in size. There is  no left ventricular hypertrophy. Left ventricular diastolic parameters  were normal. Normal left ventricular filling pressure.   Right Ventricle: The right ventricular size is normal. No increase in  right ventricular wall thickness. Right ventricular systolic function is  normal. There is normal pulmonary artery systolic pressure. The tricuspid  regurgitant velocity is 1.56 m/s, and  with an assumed right atrial pressure of 3 mmHg, the estimated right  ventricular systolic pressure is 12.7 mmHg.   Left Atrium: Left atrial size was normal in size.   Right Atrium: Right atrial size was normal in size.   Pericardium: There is no  evidence of pericardial effusion.   Mitral Valve: The mitral valve is normal in structure. Normal mobility of  the mitral valve  leaflets. No evidence of mitral valve regurgitation. No  evidence of mitral valve stenosis.   Tricuspid Valve: The tricuspid valve is normal in structure. Tricuspid  valve regurgitation is not demonstrated. No evidence of tricuspid  stenosis.   Aortic Valve: The aortic valve is normal in structure. Aortic valve  regurgitation is not visualized. No aortic stenosis is present.   Pulmonic Valve: The pulmonic valve was normal in structure. Pulmonic valve  regurgitation is not visualized. No evidence of pulmonic stenosis.   Aorta: The aortic root is normal in size and structure.   Venous: The inferior vena cava is normal in size with greater than 50%  respiratory variability, suggesting right atrial pressure of 3 mmHg.    I have personally reviewed and interpreted all radiology studies and my findings are as above.  VENTILATOR SETTINGS:    Cultures 4/13 VDRL negative 4/13 HSV 1/2 negative 4/13 SARS coronavirus negative 4/13 HIV 1 antibody positive 4/13 HIV 2 antibody negative    Antimicrobials: Anti-infectives (From admission, onward)   Start     Stop   06/18/19 0000  vancomycin (VANCOCIN) IVPB 750 mg/150 ml premix         06/17/19 2200  vancomycin (VANCOCIN) IVPB 1000 mg/200 mL premix  Status:  Discontinued     06/17/19 1118   06/17/19 1400  ceFEPIme (MAXIPIME) 2 g in sodium chloride 0.9 % 100 mL IVPB  Status:  Discontinued     06/17/19 0653   06/17/19 1200  cefTRIAXone (ROCEPHIN) 2 g in sodium chloride 0.9 % 100 mL IVPB         06/17/19 0300  ceFEPIme (MAXIPIME) 2 g in sodium chloride 0.9 % 100 mL IVPB     06/17/19 0500   06/17/19 0300  metroNIDAZOLE (FLAGYL) IVPB 500 mg     06/17/19 0524   06/17/19 0300  vancomycin (VANCOCIN) IVPB 1000 mg/200 mL premix     06/17/19 0524       Devices    LINES / TUBES:      Continuous Infusions: . 0.9 % NaCl with KCl 40 mEq / L 125 mL/hr (06/18/19 1716)  . cefTRIAXone (ROCEPHIN)  IV 2 g (06/18/19 0942)  . vancomycin 750  mg (06/18/19 1259)     Objective: Vitals:   06/18/19 1400 06/18/19 1610 06/18/19 1700 06/18/19 1938  BP:  129/90  118/66  Pulse: 65 60 66 (!) 51  Resp: (!) 21 (!) 26 15 (!) 21  Temp:  98.6 F (37 C)  97.9 F (36.6 C)  TempSrc:  Oral  Oral  SpO2: 100% 98% 100% 99%  Weight:      Height:       No intake or output data in the 24 hours ending 06/18/19 2030 Filed Weights   06/17/19 0244  Weight: 60 kg    Examination:  General: A/O x4, no acute respiratory distress Eyes: negative scleral hemorrhage, negative anisocoria, negative icterus ENT: Negative Runny nose, negative gingival bleeding, extremely poor dentition consistent with methamphetamine use Neck:  Negative scars, masses, torticollis, lymphadenopathy, JVD Lungs: Clear to auscultation bilaterally without wheezes or crackles Cardiovascular: Regular rate and rhythm without murmur gallop or rub normal S1 and S2 Abdomen: negative abdominal pain, nondistended, positive soft, bowel sounds, no rebound, no ascites, no appreciable mass Extremities: No significant cyanosis, clubbing, or edema bilateral lower extremities Skin: Patient with multiple lesions on  her arms neck face consistent with methyl amphetamine use.  Track marks bilateral AC. Psychiatric:  Negative depression, negative anxiety, negative fatigue, negative mania  Central nervous system:  Cranial nerves II through XII intact, tongue/uvula midline, all extremities muscle strength 5/5, sensation intact throughout, negative dysarthria, negative expressive aphasia, negative receptive aphasia.  .     Data Reviewed: Care during the described time interval was provided by me .  I have reviewed this patient's available data, including medical history, events of note, physical examination, and all test results as part of my evaluation.  CBC: Recent Labs  Lab 06/17/19 0408 06/17/19 0917 06/18/19 0328  WBC 9.1 6.3 3.3*  NEUTROABS 7.6  --  1.6*  HGB 11.1* 10.3* 10.3*  HCT  37.1 34.9* 34.5*  MCV 76.8* 77.6* 76.5*  PLT 305 249 200   Basic Metabolic Panel: Recent Labs  Lab 06/17/19 0408 06/17/19 0917 06/18/19 0328  NA 138  --  137  K 2.9*  --  4.3  CL 103  --  108  CO2 22  --  23  GLUCOSE 99  --  125*  BUN 12  --  7  CREATININE 1.02* 0.69 0.64  CALCIUM 8.7*  --  7.7*  MG  --  1.8 1.8   GFR: Estimated Creatinine Clearance: 83.5 mL/min (by C-G formula based on SCr of 0.64 mg/dL). Liver Function Tests: Recent Labs  Lab 06/17/19 0408 06/18/19 0328  AST 37 28  ALT 28 22  ALKPHOS 43 32*  BILITOT 1.0 0.6  PROT 8.1 5.8*  ALBUMIN 3.8 2.4*   No results for input(s): LIPASE, AMYLASE in the last 168 hours. No results for input(s): AMMONIA in the last 168 hours. Coagulation Profile: Recent Labs  Lab 06/17/19 0408  INR 1.3*   Cardiac Enzymes: Recent Labs  Lab 06/17/19 0408  CKTOTAL 460*   BNP (last 3 results) No results for input(s): PROBNP in the last 8760 hours. HbA1C: No results for input(s): HGBA1C in the last 72 hours. CBG: No results for input(s): GLUCAP in the last 168 hours. Lipid Profile: No results for input(s): CHOL, HDL, LDLCALC, TRIG, CHOLHDL, LDLDIRECT in the last 72 hours. Thyroid Function Tests: Recent Labs    06/17/19 0917  TSH 0.395   Anemia Panel: Recent Labs    06/17/19 0917  VITAMINB12 157*   Sepsis Labs: Recent Labs  Lab 06/17/19 0408 06/17/19 0516  LATICACIDVEN 1.4 1.2    Recent Results (from the past 240 hour(s))  Blood Culture (routine x 2)     Status: None (Preliminary result)   Collection Time: 06/17/19  4:10 AM   Specimen: BLOOD LEFT ARM  Result Value Ref Range Status   Specimen Description BLOOD LEFT ARM  Final   Special Requests   Final    BOTTLES DRAWN AEROBIC AND ANAEROBIC Blood Culture results may not be optimal due to an excessive volume of blood received in culture bottles Performed at [facility], [address] [place], [place] [address]    Culture NO GROWTH 1 DAY  Final     Report Status PENDING  Incomplete  Blood Culture (routine x 2)     Status: None (Preliminary result)   Collection Time: 06/17/19  5:17 AM   Specimen: BLOOD RIGHT ARM  Result Value Ref Range Status   Specimen Description BLOOD RIGHT ARM  Final   Special Requests   Final    BOTTLES DRAWN AEROBIC AND ANAEROBIC Blood Culture adequate volume Performed at [facility]  Lab, [address] [place], [place] [address]    Culture NO GROWTH 1 DAY  Final   Report Status PENDING  Incomplete  Urine culture     Status: None   Collection Time: 06/17/19  6:07 AM   Specimen: In/Out Cath Urine  Result Value Ref Range Status   Specimen Description IN/OUT CATH URINE  Final   Special Requests NONE  Final   Culture   Final    NO GROWTH Performed at [facility], [address] [place], [place] [address]    Report Status 06/18/2019 FINAL  Final  CSF culture     Status: None (Preliminary result)   Collection Time: 06/17/19  6:09 AM   Specimen: Back; Cerebrospinal Fluid  Result Value Ref Range Status   Specimen Description BACK  Final   Special Requests NONE  Final   Gram Stain CYTOSPIN SMEAR NO WBC SEEN NO ORGANISMS SEEN   Final   Culture   Final    NO GROWTH 1 DAY Performed at [facility], [address], [place], [place] [address]    Report Status PENDING  Incomplete  SARS CORONAVIRUS 2 (TAT 6-24 HRS) Nasopharyngeal Nasopharyngeal Swab     Status: None   Collection Time: 06/17/19  7:58 AM   Specimen: Nasopharyngeal Swab  Result Value Ref Range Status   SARS Coronavirus 2 NEGATIVE NEGATIVE Final    Comment: (NOTE) SARS-CoV-2 target nucleic acids are NOT DETECTED. The SARS-CoV-2 RNA is generally detectable in upper and lower respiratory specimens during the acute phase of infection. Negative results do not preclude SARS-CoV-2 infection, do not rule out co-infections with other pathogens, and should not be used as the sole basis for treatment or other patient management  decisions. Negative results must be combined with clinical observations, patient history, and epidemiological information. The expected result is Negative. Fact Sheet for Patients: HairSlick.no Fact Sheet for Healthcare Providers: quierodirigir.com This test is not yet approved or cleared by the [place] FDA and  has been authorized for detection and/or diagnosis of SARS-CoV-2 by FDA under an Emergency Use Authorization (EUA). This EUA will remain  in effect (meaning this test can be used) for the duration of the COVID-19 declaration under Section 56 4(b)(1) of the Act, 21 U.S.C. section 360bbb-3(b)(1), unless the authorization is terminated or revoked sooner. Performed at [facility], [address], [place], [place] [address]          Radiology Studies: CT HEAD WO CONTRAST  Result Date: 06/17/2019 CLINICAL DATA:  Mental status change with unknown cause EXAM: CT HEAD WITHOUT CONTRAST TECHNIQUE: Contiguous axial images were obtained from the base of the skull through the vertex without intravenous contrast. COMPARISON:  None. FINDINGS: Brain: No evidence of acute infarction, hemorrhage, hydrocephalus, extra-axial collection or mass lesion/mass effect. Vascular: No hyperdense vessel or unexpected calcification. Skull: Normal. Negative for fracture or focal lesion. Sinuses/Orbits: No acute finding. Other: Partial coverage of the maxillary teeth shows multiple cavities. IMPRESSION: Negative CT of the brain. Electronically Signed   By: Marnee Spring M.D.   On: 06/17/2019 05:16   DG Chest Port 1 View  Result Date: 06/17/2019 CLINICAL DATA:  Fever EXAM: PORTABLE CHEST 1 VIEW COMPARISON:  None. FINDINGS: The heart size and mediastinal contours are within normal limits. Both lungs are clear. The visualized skeletal structures are unremarkable. IMPRESSION: No active disease. Electronically Signed   By: Jasmine Pang M.D.   On:  06/17/2019 03:55   ECHOCARDIOGRAM COMPLETE  Result Date: 06/17/2019    ECHOCARDIOGRAM REPORT   Patient Name:   LISANNE PONCE Date of Exam: 06/17/2019 Medical Rec #:  [id number]        Height:       62.0 in Accession #:    [id number]       Weight:       132.3 lb Date of Birth:  10/15/1980        BSA:          1.604 m Patient Age:    56 years         BP:           129/79 mmHg Patient Gender: F                HR:           81 bpm. Exam Location:  Inpatient Procedure: 2D Echo, Cardiac Doppler and Color Doppler Indications:    Murmur 785.2                 Endocarditis I38  History:        Patient has no prior history of Echocardiogram examinations.  Sonographer:    Vickie Epley RDCS Referring Phys: [id number] [place]  1. Left ventricular ejection fraction, by estimation, is 55 to 60%. The left ventricle has normal function. The left ventricle has no regional wall motion abnormalities. Left ventricular diastolic parameters were normal.  2. Right ventricular systolic function is normal. The right ventricular size is normal. There is normal pulmonary artery systolic pressure.  3. The mitral valve is normal in structure. No evidence of mitral valve regurgitation. No evidence of mitral stenosis.  4. The aortic valve is normal in structure. Aortic valve regurgitation is not visualized. No aortic stenosis is present.  5. The inferior vena cava is normal in size with greater than 50% respiratory variability, suggesting right atrial pressure of 3 mmHg. Conclusion(s)/Recommendation(s): No evidence of valvular vegetations on this transthoracic echocardiogram. Would recommend a transesophageal echocardiogram to exclude infective endocarditis if clinically indicated. FINDINGS  Left Ventricle: Left ventricular ejection fraction, by estimation, is 55 to 60%. The left ventricle has normal function. The left ventricle has no regional wall motion abnormalities. The left ventricular internal cavity size was normal in  size. There is  no left ventricular hypertrophy. Left ventricular diastolic parameters were normal. Normal left ventricular filling pressure. Right Ventricle: The right ventricular size is normal. No increase in right ventricular wall thickness. Right ventricular systolic function is normal. There is normal pulmonary artery systolic pressure. The tricuspid regurgitant velocity is 1.56 m/s, and  with an assumed right atrial pressure of 3 mmHg, the estimated right ventricular systolic pressure is 18.8 mmHg. Left Atrium: Left atrial size was normal in size. Right Atrium: Right atrial size was normal in size. Pericardium: There is no evidence of pericardial effusion. Mitral Valve: The mitral valve is normal in structure. Normal mobility of the mitral valve leaflets. No evidence of mitral valve regurgitation. No evidence of mitral valve stenosis. Tricuspid Valve: The tricuspid valve is normal in structure. Tricuspid valve regurgitation is not demonstrated. No evidence of tricuspid stenosis. Aortic Valve: The aortic valve is normal in structure. Aortic valve regurgitation is not visualized. No aortic stenosis is present. Pulmonic Valve: The pulmonic valve was normal in structure. Pulmonic valve regurgitation is not visualized. No evidence of pulmonic stenosis. Aorta: The aortic root is normal in size and structure. Venous: The inferior vena cava is normal in size with greater  than 50% respiratory variability, suggesting right atrial pressure of 3 mmHg. IAS/Shunts: No atrial level shunt detected by color flow Doppler.  LEFT VENTRICLE PLAX 2D LVIDd:         4.36 cm  Diastology LVIDs:         3.15 cm  LV e' lateral:   13.40 cm/s LV PW:         0.77 cm  LV E/e' lateral: 5.8 LV IVS:        0.79 cm  LV e' medial:    10.10 cm/s LVOT diam:     1.50 cm  LV E/e' medial:  7.7 LV SV:         36 LV SV Index:   23 LVOT Area:     1.77 cm  RIGHT VENTRICLE RV S prime:     14.70 cm/s TAPSE (M-mode): 1.9 cm LEFT ATRIUM             Index        RIGHT ATRIUM          Index LA diam:        3.50 cm 2.18 cm/m  RA Area:     8.84 cm LA Vol (A2C):   33.1 ml 20.64 ml/m RA Volume:   16.90 ml 10.54 ml/m LA Vol (A4C):   19.1 ml 11.91 ml/m LA Biplane Vol: 26.5 ml 16.52 ml/m  AORTIC VALVE LVOT Vmax:   106.00 cm/s LVOT Vmean:  71.900 cm/s LVOT VTI:    0.205 m  AORTA Ao Root diam: 2.60 cm MITRAL VALVE               TRICUSPID VALVE MV Area (PHT): 4.80 cm    TR Peak grad:   9.7 mmHg MV Decel Time: 158 msec    TR Vmax:        156.00 cm/s MV E velocity: 77.60 cm/s MV A velocity: 69.40 cm/s  SHUNTS MV E/A ratio:  1.12        Systemic VTI:  0.20 m                            Systemic Diam: 1.50 cm Rachelle HoraMihai Croitoru MD Electronically signed by Thurmon FairMihai Croitoru MD Signature Date/Time: 06/17/2019/11:26:21 AM    Final         Scheduled Meds: . enoxaparin (LOVENOX) injection  40 mg Subcutaneous Daily  . folic acid  1 mg Intravenous Daily   Continuous Infusions: . 0.9 % NaCl with KCl 40 mEq / L 125 mL/hr (06/18/19 1716)  . cefTRIAXone (ROCEPHIN)  IV 2 g (06/18/19 0942)  . vancomycin 750 mg (06/18/19 1259)     LOS: 1 day    Time spent:40 min    Ronneisha Jett, Roselind MessierURTIS J, MD [facility] Pager [phone]  If 7PM-7AM, please contact night-coverage [email or web address] Password [email or web address] 06/18/2019, 8:30 PM

## 2019-06-19 DIAGNOSIS — F1590 Other stimulant use, unspecified, uncomplicated: Secondary | ICD-10-CM

## 2019-06-19 DIAGNOSIS — Z885 Allergy status to narcotic agent status: Secondary | ICD-10-CM

## 2019-06-19 DIAGNOSIS — K0889 Other specified disorders of teeth and supporting structures: Secondary | ICD-10-CM

## 2019-06-19 DIAGNOSIS — B20 Human immunodeficiency virus [HIV] disease: Secondary | ICD-10-CM

## 2019-06-19 DIAGNOSIS — B1920 Unspecified viral hepatitis C without hepatic coma: Secondary | ICD-10-CM

## 2019-06-19 DIAGNOSIS — Z881 Allergy status to other antibiotic agents status: Secondary | ICD-10-CM

## 2019-06-19 LAB — CBC
HCT: 33.1 % — ABNORMAL LOW (ref 36.0–46.0)
Hemoglobin: 9.7 g/dL — ABNORMAL LOW (ref 12.0–15.0)
MCH: 22.4 pg — ABNORMAL LOW (ref 26.0–34.0)
MCHC: 29.3 g/dL — ABNORMAL LOW (ref 30.0–36.0)
MCV: 76.3 fL — ABNORMAL LOW (ref 80.0–100.0)
Platelets: 204 10*3/uL (ref 150–400)
RBC: 4.34 MIL/uL (ref 3.87–5.11)
RDW: 17.1 % — ABNORMAL HIGH (ref 11.5–15.5)
WBC: 3.5 10*3/uL — ABNORMAL LOW (ref 4.0–10.5)
nRBC: 0 % (ref 0.0–0.2)

## 2019-06-19 LAB — COMPREHENSIVE METABOLIC PANEL
ALT: 21 U/L (ref 0–44)
AST: 25 U/L (ref 15–41)
Albumin: 2.6 g/dL — ABNORMAL LOW (ref 3.5–5.0)
Alkaline Phosphatase: 29 U/L — ABNORMAL LOW (ref 38–126)
Anion gap: 9 (ref 5–15)
BUN: 5 mg/dL — ABNORMAL LOW (ref 6–20)
CO2: 19 mmol/L — ABNORMAL LOW (ref 22–32)
Calcium: 8.4 mg/dL — ABNORMAL LOW (ref 8.9–10.3)
Chloride: 110 mmol/L (ref 98–111)
Creatinine, Ser: 0.56 mg/dL (ref 0.44–1.00)
GFR calc Af Amer: >60 mL/min (ref >60)
GFR calc non Af Amer: >60 mL/min (ref >60)
Glucose, Bld: 85 mg/dL (ref 70–99)
Potassium: 4.3 mmol/L (ref 3.5–5.1)
Sodium: 138 mmol/L (ref 135–145)
Total Bilirubin: 0.4 mg/dL (ref 0.3–1.2)
Total Protein: 6.2 g/dL — ABNORMAL LOW (ref 6.5–8.1)

## 2019-06-19 LAB — HEPATITIS A ANTIBODY, TOTAL: hep A Total Ab: REACTIVE — AB

## 2019-06-19 LAB — MAGNESIUM: Magnesium: 1.6 mg/dL — ABNORMAL LOW (ref 1.7–2.4)

## 2019-06-19 LAB — PHOSPHORUS: Phosphorus: 2.9 mg/dL (ref 2.5–4.6)

## 2019-06-19 MED ORDER — BIKTARVY 50-200-25 MG PO TABS: 1.0000 | ORAL_TABLET | Freq: Every day | ORAL | 0 refills | Status: DC

## 2019-06-19 MED ORDER — MAGNESIUM SULFATE 50 % IJ SOLN
3.0000 g | Freq: Once | INTRAVENOUS | Status: AC
  Administered 2019-06-19: 3 g via INTRAVENOUS
  Filled 2019-06-19: qty 6

## 2019-06-19 MED ORDER — BICTEGRAVIR-EMTRICITAB-TENOFOV 50-200-25 MG PO TABS
1.0000 | ORAL_TABLET | Freq: Every day | ORAL | Status: DC
  Administered 2019-06-19 – 2019-06-20 (×2): 1 via ORAL
  Filled 2019-06-19 (×2): qty 1

## 2019-06-19 MED FILL — BIKTARVY 50-200-25 MG TABS: 50-200-25 | 30 days supply | Qty: 30 | Fill #0

## 2019-06-19 NOTE — Consult Note (Signed)
[facility]    Date of Admission:  06/17/2019     Total days of antibiotics 4               Reason for Consult: HIV disease   Referring Provider:  [facility] / [place] Primary Care Provider: Patient, No Pcp Per   ASSESSMENT:  Cindy Robertson is a 29 year old female admitted with agitation and altered mental status with concern for infectious origin with meningitis work-up being negative and CT scan without significant findings.  Found to have HIV-1 which is a new diagnosis.  Risk factor for acquiring HIV includes IV drug use and sexual contact. Initial viral load is 165,000 with CD4 nadir to be determined. She has no current signs/symptoms associated with opportunistic infection. We reviewed the pathogenesis, transmission, prevention, risk of progression if left untreated and treatment options for HIV. Currently not working and will need to apply for [employer] and ADAP/UMAP. Will start her on Biktarvy today and obtain CD4 count and basic HIV lab work. Will be able to obtain 30 day supply of medication from [facility]. She also has reported Hepatitis C infection. She does not appear to have any other infection at present and will discontinue antibiotics at this time and monitor.   PLAN:  1. Obtain HIV and Hepatitis C lab work. 2. Start Biktarvy. 3. Will arrange financial assistance in [facility].  4. Discontinue vancomycin and    Principal Problem:   Toxic metabolic encephalopathy Active Problems:   SIRS (systemic inflammatory response syndrome) (HCC)   Hypokalemia   Overdose   HIV (human immunodeficiency virus infection) ([place])   Drug abuse ([place])   . enoxaparin (LOVENOX) injection  40 mg Subcutaneous Daily  . folic acid  1 mg Intravenous Daily     HPI: Cindy Robertson is a 30 y.o. female with no previous significant medical history brought to the hospital via EMS from a local hotel where she was found to be extremely  agitated and acting erratically.  Admitted to meth use 1 to 2 days prior to presentation.  In the ED she was tachycardic and febrile with a temp of 101.4 F.  Throughout her time in the emergency department she required several sedative agents including Geodon, propofol, and ketamine due to agitation.  CT head without significant findings.  Chest x-ray with no cardiopulmonary disease.  Lumbar puncture performed with concern for meningitis with white blood cell count of 4, protein 35 and glucose of 73.  There was concern for infectious etiology of her symptoms.  Cindy Robertson has been afebrile since admission with blood cultures remaining without growth to date.  CSF culture without growth.  Subsequently HIV found to be positive with confirmatory viral load of 165,000.  This is a new diagnosis.  Her tox screen was positive for amphetamines and benzodiazepines.   Cindy Robertson was last tested for HIV disease in 2016. She has used IV drugs and is sexually active which are her primary risk factors for acquiring HIV disease. She has informed her mother of her diagnosis and is supportive. Currently not working Denies night sweats, headaches, changes in vision, neck pain/stiffness, nausea, diarrhea, vomiting, lesions or rashes.    Review of Systems: Review of Systems  Constitutional: Negative for chills, fever and weight loss.  Respiratory: Negative for cough, shortness of breath and wheezing.   Cardiovascular: Negative for chest pain and leg swelling.  Gastrointestinal: Negative for abdominal pain, constipation, diarrhea, nausea and vomiting.  Skin: Negative for rash.     History reviewed. No pertinent past medical history.  Social History   Tobacco Use  . Smoking status: Unknown If Ever Smoked  Substance Use Topics  . Alcohol use: Not on file  . Drug use: Not on file    Family History  Family history unknown: Yes    Allergies  Allergen Reactions  . Bactrim  [Sulfamethoxazole-Trimethoprim] Other (See Comments)    Per pt: makes her "blood pressure plummet"   . Codeine Other (See Comments)    Per pt: makes her feel like she's going to have a heart attack    OBJECTIVE: Blood pressure 125/81, pulse (!) 44, temperature 97.7 F (36.5 C), temperature source Oral, resp. rate 15, height 5\' 2"  (1.575 m), weight 60 kg, SpO2 99 %.  Physical Exam Constitutional:      General: She is not in acute distress.    Appearance: She is well-developed.     Comments: Lying in bed with head of bed elevated; pleasant.   HENT:     Mouth/Throat:     Comments: Poor dentition/oral hygiene.  Eyes:     Conjunctiva/sclera: Conjunctivae normal.  Cardiovascular:     Rate and Rhythm: Normal rate and regular rhythm.     Heart sounds: Normal heart sounds. No murmur. No friction rub. No gallop.   Pulmonary:     Effort: Pulmonary effort is normal. No respiratory distress.     Breath sounds: Normal breath sounds. No wheezing or rales.  Chest:     Chest wall: No tenderness.  Abdominal:     General: Bowel sounds are normal.     Palpations: Abdomen is soft.     Tenderness: There is no abdominal tenderness.  Musculoskeletal:     Cervical back: Neck supple.  Lymphadenopathy:     Cervical: No cervical adenopathy.  Skin:    General: Skin is warm and dry.     Findings: No rash.  Neurological:     Mental Status: She is alert and oriented to person, place, and time.  Psychiatric:        Behavior: Behavior normal.        Thought Content: Thought content normal.        Judgment: Judgment normal.     Lab Results Lab Results  Component Value Date   WBC 3.5 (L) 06/18/2019   HGB 9.7 (L) 06/18/2019   HCT 33.1 (L) 06/18/2019   MCV 76.3 (L) 06/18/2019   PLT 204 06/18/2019    Lab Results  Component Value Date   CREATININE 0.56 06/18/2019   BUN 5 (L) 06/18/2019   NA 138 06/18/2019   K 4.3 06/18/2019   CL 110 06/18/2019   CO2 19 (L) 06/18/2019    Lab Results    Component Value Date   ALT 21 06/18/2019   AST 25 06/18/2019   ALKPHOS 29 (L) 06/18/2019   BILITOT 0.4 06/18/2019     Microbiology: Recent Results (from the past 240 hour(s))  Blood Culture (routine x 2)     Status: None (Preliminary result)   Collection Time: 06/17/19  4:10 AM   Specimen: BLOOD LEFT ARM  Result Value Ref Range Status   Specimen Description BLOOD LEFT ARM  Final   Special Requests   Final    BOTTLES DRAWN AEROBIC AND ANAEROBIC Blood Culture results may not be optimal due to an excessive volume of blood received in culture bottles Performed at [facility], [address], [place] [address]    Culture NO GROWTH 1 DAY  Final   Report Status PENDING  Incomplete  Blood Culture (routine x 2)     Status: None (Preliminary result)   Collection Time: 06/17/19  5:17 AM   Specimen: BLOOD RIGHT ARM  Result Value Ref Range Status   Specimen Description BLOOD RIGHT ARM  Final   Special Requests   Final    BOTTLES DRAWN AEROBIC AND ANAEROBIC Blood Culture adequate volume Performed at [facility], [address], [place], [place] [address]    Culture NO GROWTH 1 DAY  Final   Report Status PENDING  Incomplete  Urine culture     Status: None   Collection Time: 06/17/19  6:07 AM   Specimen: In/Out Cath Urine  Result Value Ref Range Status   Specimen Description IN/OUT CATH URINE  Final   Special Requests NONE  Final   Culture   Final    NO GROWTH Performed at [facility], [address], [place], [place] [address]    Report Status 06/18/2019 FINAL  Final  CSF culture     Status: None (Preliminary result)   Collection Time: 06/17/19  6:09 AM   Specimen: Back; Cerebrospinal Fluid  Result Value Ref Range Status   Specimen Description BACK  Final   Special Requests NONE  Final   Gram Stain CYTOSPIN SMEAR NO WBC SEEN NO ORGANISMS SEEN   Final   Culture   Final    NO GROWTH 1 DAY Performed at [facility], [address],  [place], [place] [address]    Report Status PENDING  Incomplete  SARS CORONAVIRUS 2 (TAT 6-24 HRS) Nasopharyngeal Nasopharyngeal Swab     Status: None   Collection Time: 06/17/19  7:58 AM   Specimen: Nasopharyngeal Swab  Result Value Ref Range Status   SARS Coronavirus 2 NEGATIVE NEGATIVE Final    Comment: (NOTE) SARS-CoV-2 target nucleic acids are NOT DETECTED. The SARS-CoV-2 RNA is generally detectable in upper and lower respiratory specimens during the acute phase of infection. Negative results do not preclude SARS-CoV-2 infection, do not rule out co-infections with other pathogens, and should not be used as the sole basis for treatment or other patient management decisions. Negative results must be combined with clinical observations, patient history, and epidemiological information. The expected result is Negative. Fact Sheet for Patients: HairSlick.no Fact Sheet for Healthcare Providers: quierodirigir.com This test is not yet approved or cleared by the [place] FDA and  has been authorized for detection and/or diagnosis of SARS-CoV-2 by FDA under an Emergency Use Authorization (EUA). This EUA will remain  in effect (meaning this test can be used) for the duration of the COVID-19 declaration under Section 56 4(b)(1) of the Act, 21 U.S.C. section 360bbb-3(b)(1), unless the authorization is terminated or revoked sooner. Performed at [facility], [address] [place], [place] [address]      Marcos Eke, NP [facility]  06/19/2019  9:58 AM

## 2019-06-19 NOTE — Progress Notes (Signed)
PROGRESS NOTE    Cindy Robertson  JXB:[id number] DOB: 1990-05-05 DOA: 06/17/2019 PCP: Patient, No Pcp Per     Brief Narrative:  29 yo WF PMHx  PMHx IV drug use, methyl amphetamine use, found outside local hotel by police due to observed extreme agitation and odd behavior.    EMS was called and brought the patient into [facility] emergency department for evaluation.  Patient is an extremely poor historian due to extreme agitation and altered mental status.  Upon evaluation in the emergency department patient continued erratic, agitated behavior and did not cooperate with commands or plan of care.  Patient was found to be in SIRS with tachycardia, tachypnea and fever of 101.36F.  Throughout the emergency department stay patient required several sedative agents including Geodon, propofol and ketamine administration.  Patient underwent thorough work-up to identify cause of patient's significant encephalopathy including CT imaging of the head, assess, hepatic function panel, LFT, ethanol level, toxicology screen and lumbar puncture.  The hospitalist group was then called to assess the patient for admission to the hospital.    Subjective: 4/15 A/O x4 - CP, negative S OB, negative abdominal pain.  negative S OB.   Assessment & Plan:   Principal Problem:   Toxic metabolic encephalopathy Active Problems:   SIRS (systemic inflammatory response syndrome) (HCC)   Hypokalemia   Overdose   HIV (human immunodeficiency virus infection) (HCC)   Drug abuse (HCC)  Toxic metabolic encephalopathy with SIRS/Meningitis? -Most likely secondary to patient's methamphetamine use. -Resolved -CSF NOT CONSISTENT with meningitis however given patient's immunosuppressed state we will continue antibiotics until ID sees patient. -4/15 ID discontinue antibiotics   Fever with unclear source -See metabolic encephalopathy -No findings on echocardiogram to suggest endocarditis.  TEE not required.  HIV  positive -Discussed case with Dr. Staci Righter, ID.  Was already aware of positive finding.  ID will officially see patient in the A.m. -4/15 start Biktarvy and observe overnight to ensure patient tolerates it. -Discharge patient on 4/16 ensure she has prescription for 30-day supply.  Per ID will be able obtain medication from [facility] -Per ID they will arrange for financial assistance in [facility] -Prior to discharge NCM Samson Frederic will provide patient with information on [employer] and ADAP/UMAP. -  Hypokalemia -Resolved  Hypomagnesmia -Magnesium IV 3 g  Drug abuse -4/13 urine tox screen positive amphetamines and benzodiazepine -4/13 EtOH negative -4/14 LCSW consulted for drug abuse resources  Hypocalcemia   Goals of care -4/14 LCSW consult; drug abuse resources, patient without insurance now HIV positive will need help with medication, obtaining insurance to pay for medication.      DVT prophylaxis: Lovenox Code Status: Full Family Communication: 4/14 mother at bedside for discussion of plan of care answered all questions Disposition Plan:  1.  Where the patient is from 2.  Anticipated d/c place. 3.  Barriers to d/c new HIV patient per ID guidelines   Consultants:  ID   Procedures/Significant Events:  4/13 Echocardiogram;Left Ventricle: LVEF=55 to 60%.     I have personally reviewed and interpreted all radiology studies and my findings are as above.  VENTILATOR SETTINGS:    Cultures 4/13 VDRL negative 4/13 HSV 1/2 negative 4/13 SARS coronavirus negative 4/13 HIV 1 antibody positive 4/13 HIV 2 antibody negative    Antimicrobials: Anti-infectives (From admission, onward)   Start     Stop   06/19/19 1200  bictegravir-emtricitabine-tenofovir AF (BIKTARVY) 50-200-25 MG per tablet 1 tablet  06/19/19 0000  bictegravir-emtricitabine-tenofovir AF (BIKTARVY) 50-200-25 MG TABS tablet         06/18/19 0000  vancomycin (VANCOCIN)  IVPB 750 mg/150 ml premix  Status:  Discontinued     06/19/19 0932   06/17/19 2200  vancomycin (VANCOCIN) IVPB 1000 mg/200 mL premix  Status:  Discontinued     06/17/19 1118   06/17/19 1400  ceFEPIme (MAXIPIME) 2 g in sodium chloride 0.9 % 100 mL IVPB  Status:  Discontinued     06/17/19 0653   06/17/19 1200  cefTRIAXone (ROCEPHIN) 2 g in sodium chloride 0.9 % 100 mL IVPB  Status:  Discontinued     06/19/19 0932   06/17/19 0300  ceFEPIme (MAXIPIME) 2 g in sodium chloride 0.9 % 100 mL IVPB     06/17/19 0500   06/17/19 0300  metroNIDAZOLE (FLAGYL) IVPB 500 mg     06/17/19 0524   06/17/19 0300  vancomycin (VANCOCIN) IVPB 1000 mg/200 mL premix     06/17/19 0524       Devices    LINES / TUBES:      Continuous Infusions:    Objective: Vitals:   06/19/19 0300 06/19/19 1000 06/19/19 1200 06/19/19 1245  BP: 125/81   122/71  Pulse: (!) 44 (!) 58 60 (!) 59  Resp: 15 19 (!) 24 18  Temp: 97.7 F (36.5 C)     TempSrc: Oral     SpO2: 99% 99% 100% 100%  Weight:      Height:       No intake or output data in the 24 hours ending 06/19/19 1901 Filed Weights   06/17/19 0244  Weight: 60 kg    Examination:  General: A/O x4, no acute respiratory distress Eyes: negative scleral hemorrhage, negative anisocoria, negative icterus ENT: Negative Runny nose, negative gingival bleeding, extremely poor dentition consistent with methamphetamine use Neck:  Negative scars, masses, torticollis, lymphadenopathy, JVD Lungs: Clear to auscultation bilaterally without wheezes or crackles Cardiovascular: Regular rate and rhythm without murmur gallop or rub normal S1 and S2 Abdomen: negative abdominal pain, nondistended, positive soft, bowel sounds, no rebound, no ascites, no appreciable mass Extremities: No significant cyanosis, clubbing, or edema bilateral lower extremities Skin: Patient with multiple lesions on her arms neck face consistent with methyl amphetamine use.  Track marks bilateral  AC. Psychiatric:  Negative depression, negative anxiety, negative fatigue, negative mania  Central nervous system:  Cranial nerves II through XII intact, tongue/uvula midline, all extremities muscle strength 5/5, sensation intact throughout, negative dysarthria, negative expressive aphasia, negative receptive aphasia.  .     Data Reviewed: Care during the described time interval was provided by me .  I have reviewed this patient's available data, including medical history, events of note, physical examination, and all test results as part of my evaluation.  CBC: Recent Labs  Lab 06/17/19 0408 06/17/19 0917 06/18/19 0328 06/18/19 2352  WBC 9.1 6.3 3.3* 3.5*  NEUTROABS 7.6  --  1.6*  --   HGB 11.1* 10.3* 10.3* 9.7*  HCT 37.1 34.9* 34.5* 33.1*  MCV 76.8* 77.6* 76.5* 76.3*  PLT 305 249 200 130   Basic Metabolic Panel: Recent Labs  Lab 06/17/19 0408 06/17/19 0917 06/18/19 0328 06/18/19 2352  NA 138  --  137 138  K 2.9*  --  4.3 4.3  CL 103  --  108 110  CO2 22  --  23 19*  GLUCOSE 99  --  125* 85  BUN 12  --  7 5*  CREATININE 1.02* 0.69 0.64 0.56  CALCIUM 8.7*  --  7.7* 8.4*  MG  --  1.8 1.8 1.6*  PHOS  --   --   --  2.9   GFR: Estimated Creatinine Clearance: 83.5 mL/min (by C-G formula based on SCr of 0.56 mg/dL). Liver Function Tests: Recent Labs  Lab 06/17/19 0408 06/18/19 0328 06/18/19 2352  AST 37 28 25  ALT 28 22 21   ALKPHOS 43 32* 29*  BILITOT 1.0 0.6 0.4  PROT 8.1 5.8* 6.2*  ALBUMIN 3.8 2.4* 2.6*   No results for input(s): LIPASE, AMYLASE in the last 168 hours. No results for input(s): AMMONIA in the last 168 hours. Coagulation Profile: Recent Labs  Lab 06/17/19 0408  INR 1.3*   Cardiac Enzymes: Recent Labs  Lab 06/17/19 0408  CKTOTAL 460*   BNP (last 3 results) No results for input(s): PROBNP in the last 8760 hours. HbA1C: No results for input(s): HGBA1C in the last 72 hours. CBG: No results for input(s): GLUCAP in the last 168  hours. Lipid Profile: No results for input(s): CHOL, HDL, LDLCALC, TRIG, CHOLHDL, LDLDIRECT in the last 72 hours. Thyroid Function Tests: Recent Labs    06/17/19 0917  TSH 0.395   Anemia Panel: Recent Labs    06/17/19 0917  VITAMINB12 157*   Sepsis Labs: Recent Labs  Lab 06/17/19 0408 06/17/19 0516  LATICACIDVEN 1.4 1.2    Recent Results (from the past 240 hour(s))  Blood Culture (routine x 2)     Status: None (Preliminary result)   Collection Time: 06/17/19  4:10 AM   Specimen: BLOOD LEFT ARM  Result Value Ref Range Status   Specimen Description BLOOD LEFT ARM  Final   Special Requests   Final    BOTTLES DRAWN AEROBIC AND ANAEROBIC Blood Culture results may not be optimal due to an excessive volume of blood received in culture bottles   Culture   Final    NO GROWTH 2 DAYS Performed at [facility], [address] [place], [place]    Report Status PENDING  Incomplete  Blood Culture (routine x 2)     Status: None (Preliminary result)   Collection Time: 06/17/19  5:17 AM   Specimen: BLOOD RIGHT ARM  Result Value Ref Range Status   Specimen Description BLOOD RIGHT ARM  Final   Special Requests   Final    BOTTLES DRAWN AEROBIC AND ANAEROBIC Blood Culture adequate volume   Culture   Final    NO GROWTH 2 DAYS Performed at [facility], [address], [place], [place]    Report Status PENDING  Incomplete  Urine culture     Status: None   Collection Time: 06/17/19  6:07 AM   Specimen: In/Out Cath Urine  Result Value Ref Range Status   Specimen Description IN/OUT CATH URINE  Final   Special Requests NONE  Final   Culture   Final    NO GROWTH Performed at [facility], [address] [place], [place]    Report Status 06/18/2019 FINAL  Final  CSF culture     Status: None (Preliminary result)   Collection Time: 06/17/19  6:09 AM   Specimen: Back; Cerebrospinal Fluid  Result Value Ref Range Status   Specimen  Description BACK  Final   Special Requests NONE  Final   Gram Stain CYTOSPIN SMEAR NO WBC SEEN NO ORGANISMS SEEN   Final   Culture   Final  NO GROWTH 2 DAYS Performed at [facility], [address], [place], [place] [address]    Report Status PENDING  Incomplete  SARS CORONAVIRUS 2 (TAT 6-24 HRS) Nasopharyngeal Nasopharyngeal Swab     Status: None   Collection Time: 06/17/19  7:58 AM   Specimen: Nasopharyngeal Swab  Result Value Ref Range Status   SARS Coronavirus 2 NEGATIVE NEGATIVE Final    Comment: (NOTE) SARS-CoV-2 target nucleic acids are NOT DETECTED. The SARS-CoV-2 RNA is generally detectable in upper and lower respiratory specimens during the acute phase of infection. Negative results do not preclude SARS-CoV-2 infection, do not rule out co-infections with other pathogens, and should not be used as the sole basis for treatment or other patient management decisions. Negative results must be combined with clinical observations, patient history, and epidemiological information. The expected result is Negative. Fact Sheet for Patients: HairSlick.no Fact Sheet for Healthcare Providers: quierodirigir.com This test is not yet approved or cleared by the [place] FDA and  has been authorized for detection and/or diagnosis of SARS-CoV-2 by FDA under an Emergency Use Authorization (EUA). This EUA will remain  in effect (meaning this test can be used) for the duration of the COVID-19 declaration under Section 56 4(b)(1) of the Act, 21 U.S.C. section 360bbb-3(b)(1), unless the authorization is terminated or revoked sooner. Performed at [facility], [address] [place], [place] [address]          Radiology Studies: No results found.      Scheduled Meds: . bictegravir-emtricitabine-tenofovir AF  1 tablet Oral Daily  . enoxaparin (LOVENOX) injection  40 mg Subcutaneous Daily  . folic acid  1  mg Intravenous Daily   Continuous Infusions:    LOS: 2 days    Time spent:40 min    Hayze Gazda, Roselind Messier, MD [facility] Pager [phone]  If 7PM-7AM, please contact night-coverage [email or web address] Password [email or web address] 06/19/2019, 7:01 PM

## 2019-06-20 ENCOUNTER — Telehealth (HOSPITAL_COMMUNITY): Payer: Self-pay

## 2019-06-20 DIAGNOSIS — T50901A Poisoning by unspecified drugs, medicaments and biological substances, accidental (unintentional), initial encounter: Secondary | ICD-10-CM

## 2019-06-20 LAB — COMPREHENSIVE METABOLIC PANEL
ALT: 27 U/L (ref 0–44)
AST: 27 U/L (ref 15–41)
Albumin: 3 g/dL — ABNORMAL LOW (ref 3.5–5.0)
Alkaline Phosphatase: 37 U/L — ABNORMAL LOW (ref 38–126)
Anion gap: 9 (ref 5–15)
BUN: 8 mg/dL (ref 6–20)
CO2: 22 mmol/L (ref 22–32)
Calcium: 8.6 mg/dL — ABNORMAL LOW (ref 8.9–10.3)
Chloride: 108 mmol/L (ref 98–111)
Creatinine, Ser: 0.63 mg/dL (ref 0.44–1.00)
GFR calc Af Amer: >60 mL/min (ref >60)
GFR calc non Af Amer: >60 mL/min (ref >60)
Glucose, Bld: 105 mg/dL — ABNORMAL HIGH (ref 70–99)
Potassium: 4.2 mmol/L (ref 3.5–5.1)
Sodium: 139 mmol/L (ref 135–145)
Total Bilirubin: 0.3 mg/dL (ref 0.3–1.2)
Total Protein: 6.9 g/dL (ref 6.5–8.1)

## 2019-06-20 LAB — CBC
HCT: 36.4 % (ref 36.0–46.0)
Hemoglobin: 10.9 g/dL — ABNORMAL LOW (ref 12.0–15.0)
MCH: 22.9 pg — ABNORMAL LOW (ref 26.0–34.0)
MCHC: 29.9 g/dL — ABNORMAL LOW (ref 30.0–36.0)
MCV: 76.3 fL — ABNORMAL LOW (ref 80.0–100.0)
Platelets: 266 10*3/uL (ref 150–400)
RBC: 4.77 MIL/uL (ref 3.87–5.11)
RDW: 17.2 % — ABNORMAL HIGH (ref 11.5–15.5)
WBC: 4.7 10*3/uL (ref 4.0–10.5)
nRBC: 0 % (ref 0.0–0.2)

## 2019-06-20 LAB — HCV RNA (INTERNATIONAL UNITS)
HCV RNA (International Units): 24500000 IU/mL
HCV log10: 7.389 log10 IU/mL

## 2019-06-20 LAB — CSF CULTURE W GRAM STAIN: Culture: NO GROWTH

## 2019-06-20 LAB — HEPATITIS B SURFACE ANTIGEN: Hepatitis B Surface Ag: REACTIVE — AB

## 2019-06-20 LAB — HCV RNA QUANT

## 2019-06-20 LAB — MAGNESIUM: Magnesium: 1.8 mg/dL (ref 1.7–2.4)

## 2019-06-20 LAB — HEPATITIS B CORE ANTIBODY, TOTAL: Hep B Core Total Ab: NONREACTIVE

## 2019-06-20 LAB — HEPATITIS B CORE ANTIBODY, IGM: Hep B C IgM: NONREACTIVE

## 2019-06-20 LAB — T-HELPER CELLS (CD4) COUNT (NOT AT ARMC)
CD4 % Helper T Cell: 31 % — ABNORMAL LOW (ref 33–65)
CD4 T Cell Abs: 306 /uL — ABNORMAL LOW (ref 400–1790)

## 2019-06-20 LAB — HEPATITIS B SURFACE ANTIBODY, QUANTITATIVE: Hep B S AB Quant (Post): 44.6 m[IU]/mL (ref >9.9)

## 2019-06-20 LAB — PHOSPHORUS: Phosphorus: 4 mg/dL (ref 2.5–4.6)

## 2019-06-20 MED ORDER — MAGNESIUM SULFATE 50 % IJ SOLN
3.0000 g | Freq: Once | INTRAVENOUS | Status: DC
  Filled 2019-06-20: qty 6

## 2019-06-20 NOTE — Plan of Care (Signed)
Plan of care reviewed with pt. Call bell in reach. Pt stable at this time. Pt refusing magnesium infusing because she wants to leave sooner. RN provided education regarding medication. MD aware. Pt called ride. Discharge instructions given. Pt has no other questions at this time.  Problem: Education: Goal: Knowledge of General Education information will improve Description: Including pain rating scale, medication(s)/side effects and non-pharmacologic comfort measures Outcome: Adequate for Discharge   Problem: Health Behavior/Discharge Planning: Goal: Ability to manage health-related needs will improve Outcome: Adequate for Discharge   Problem: Clinical Measurements: Goal: Ability to maintain clinical measurements within normal limits will improve Outcome: Adequate for Discharge Goal: Will remain free from infection Outcome: Adequate for Discharge Goal: Diagnostic test results will improve Outcome: Adequate for Discharge Goal: Respiratory complications will improve Outcome: Adequate for Discharge Goal: Cardiovascular complication will be avoided Outcome: Adequate for Discharge   Problem: Activity: Goal: Risk for activity intolerance will decrease Outcome: Adequate for Discharge   Problem: Nutrition: Goal: Adequate nutrition will be maintained Outcome: Adequate for Discharge   Problem: Coping: Goal: Level of anxiety will decrease Outcome: Adequate for Discharge   Problem: Elimination: Goal: Will not experience complications related to bowel motility Outcome: Adequate for Discharge Goal: Will not experience complications related to urinary retention Outcome: Adequate for Discharge   Problem: Pain Managment: Goal: General experience of comfort will improve Outcome: Adequate for Discharge   Problem: Safety: Goal: Ability to remain free from injury will improve Outcome: Adequate for Discharge   Problem: Skin Integrity: Goal: Risk for impaired skin integrity will  decrease Outcome: Adequate for Discharge

## 2019-06-20 NOTE — TOC Transition Note (Signed)
Transition of Care [facility]) - CM/SW Discharge Note   Patient Details  Name: Kayline Sheer MRN: [id number] Date of Birth: [birth year]  Transition of Care [facility]) CM/SW Contact:  Beckie Busing, RN Phone Number: 06/20/2019, 10:44 AM   Clinical Narrative:   CM at bedside to provide patient with information about the [facility] program. CM noted that 30 day supply of Biktarvy has been delivered. CM verified with pharmacist Page Spiro  that the process for HIV medication assistance has been initiated. The clinic called her yesterday to get her UMAP process started. Pharmacy is going to see her inpatient today to schedule a follow-up appointment in the clinic. Her UMAP should be approved by the time she finishes her supply from [facility] and from then on she will fill through [facility] on [address]. She will be seen in the clinic prior to needing a refill to ensure she is tolerating Biktarvy prior to sending in these fills. The UMAP is a medication assistance program from the state and is a part of the [employer]. CM has updated pharmacist that patient reports that she uses [facility] on [address] in [place]. Patient has been updated. Patient to be discharged and has private transportation home CM will sign off.   Final next level of care: Home/Self Care Barriers to Discharge: Continued Medical Work up   Patient Goals and CMS Choice        Discharge Placement                       Discharge Plan and Services   Discharge Planning Services: Other - See comment(Substance Abuse inpatient and outpatient resources provided)                                 Social Determinants of Health (SDOH) Interventions     Readmission Risk Interventions No flowsheet data found.

## 2019-06-20 NOTE — Discharge Summary (Signed)
Physician Discharge Summary  Cindy Robertson NLZ:[id number] DOB: 1990-10-04 DOA: 06/17/2019  PCP: Patient, No Pcp Per  Admit date: 06/17/2019 Discharge date: 06/20/2019  Time spent: 30 minutes  Recommendations for Outpatient Follow-up: Toxic metabolic encephalopathy with SIRS/Meningitis? -Most likely secondary to patient's methamphetamine use. -Resolved -CSF NOT CONSISTENT with meningitis however given patient's immunosuppressed state we will continue antibiotics until ID sees patient. -4/15 ID discontinued antibiotics   Fever with unclear source -See metabolic encephalopathy -No findings on echocardiogram to suggest endocarditis.  TEE not required.  HIV positive -Discussed case with Dr. Staci Righter, ID.  Was already aware of positive finding.  ID will officially see patient in the A.m. -4/15 start Biktarvy and observe overnight to ensure patient tolerates it. -Discharge patient on 4/16 ensure she has prescription for 30-day supply.  Per ID will be able obtain medication from Advancing Access Program -Per ID they will arrange for financial assistance in [facility] clinic -Prior to discharge NCM Samson Frederic will provide patient with information on [employer] and ADAP/UMAP. -  Hypokalemia -Resolved  Hypomagnesmia -Magnesium IV 3 g prior to discharge  Drug abuse -4/13 urine tox screen positive amphetamines and benzodiazepine -4/13 EtOH negative -4/14 LCSW consulted for drug abuse resources  Hypocalcemia   Goals of care -4/14 LCSW consult; drug abuse resources, patient without insurance now HIV positive will need help with medication, obtaining insurance to pay for medication    Discharge Diagnoses:  Principal Problem:   Toxic metabolic encephalopathy Active Problems:   SIRS (systemic inflammatory response syndrome) (HCC)   Hypokalemia   Overdose   HIV (human immunodeficiency virus infection) (HCC)   Drug abuse (HCC)   Discharge Condition: Stable  Diet  recommendation: Regular  Filed Weights   06/17/19 0244  Weight: 60 kg    History of present illness:  29 yo WF PMHx  PMHx IV drug use, methyl amphetamine use, found outside local hotel by police due to observed extreme agitation and odd behavior.   EMS was called and brought the patient into [facility] emergency department for evaluation.  Patient is an extremely poor historian due to extreme agitation and altered mental status.  Upon evaluation in the emergency department patient continued erratic, agitated behavior and did not cooperate with commands or plan of care. Patient was found to be in SIRS with tachycardia, tachypnea and fever of 101.9F.  Throughout the emergency department stay patient required several sedative agents including Geodon, propofol and ketamine administration. Patient underwent thorough work-up to identify cause of patient's significant encephalopathy including CT imaging of the head, assess, hepatic function panel, LFT, ethanol level, toxicology screen and lumbar puncture. The hospitalist group was then called to assess the patient for admission to the hospital.  Hospital Course:  Newly diagnosed HIV positive patient.  ID consulted patient now started on appropriate medication.  Will follow up with ID clinic.  Patient provided resources for medication and additional resources for drug abuse.  Counseled extensively that her drug abuse must stop immediately as her HIV medication can have LETHAL interaction with street drugs.  Procedures: 4/13 Echocardiogram;Left Ventricle: LVEF=55 to 60%.   Consultations: ID    Cultures  4/13 VDRL negative 4/13 HSV 1/2 negative 4/13 SARS coronavirus negative 4/13 HIV 1 antibody positive 4/13 HIV 2 antibody negative   Antibiotics Anti-infectives (From admission, onward)   Start     Stop   06/19/19 1200  bictegravir-emtricitabine-tenofovir AF (BIKTARVY) 50-200-25 MG per tablet 1 tablet         06/19/19 0000  bictegravir-emtricitabine-tenofovir AF (BIKTARVY) 50-200-25 MG TABS tablet         06/18/19 0000  vancomycin (VANCOCIN) IVPB 750 mg/150 ml premix  Status:  Discontinued     06/19/19 0932   06/17/19 2200  vancomycin (VANCOCIN) IVPB 1000 mg/200 mL premix  Status:  Discontinued     06/17/19 1118   06/17/19 1400  ceFEPIme (MAXIPIME) 2 g in sodium chloride 0.9 % 100 mL IVPB  Status:  Discontinued     06/17/19 0653   06/17/19 1200  cefTRIAXone (ROCEPHIN) 2 g in sodium chloride 0.9 % 100 mL IVPB  Status:  Discontinued     06/19/19 0932   06/17/19 0300  ceFEPIme (MAXIPIME) 2 g in sodium chloride 0.9 % 100 mL IVPB     06/17/19 0500   06/17/19 0300  metroNIDAZOLE (FLAGYL) IVPB 500 mg     06/17/19 0524   06/17/19 0300  vancomycin (VANCOCIN) IVPB 1000 mg/200 mL premix     06/17/19 0524       Discharge Exam: Vitals:   06/19/19 1200 06/19/19 1245 06/19/19 2100 06/20/19 0813  BP:  122/71 (!) 115/100 136/62  Pulse: 60 (!) 59 65 (!) 44  Resp: (!) Temp:   98 F (36.7 C) 98.6 F (37 C)  TempSrc:   Oral Oral  SpO2: 100% 100% 100% 100%  Weight:      Height:       General: A/O x4, no acute respiratory distress Eyes: negative scleral hemorrhage, negative anisocoria, negative icterus ENT: Negative Runny nose, negative gingival bleeding, extremely poor dentition consistent with methamphetamine use Neck:  Negative scars, masses, torticollis, lymphadenopathy, JVD Lungs: Clear to auscultation bilaterally without wheezes or crackles Cardiovascular: Regular rate and rhythm without murmur gallop or rub normal S1 and S2 Abdomen: negative abdominal pain, nondistended, positive soft, bowel sounds, no rebound, no ascites, no appreciable mass Extremities: No significant cyanosis, clubbing, or edema bilateral lower extremities Skin: Patient with multiple lesions on her arms neck face consistent with methyl amphetamine use.  Track marks bilateral AC. Psychiatric:  Negative depression, negative  anxiety, negative fatigue, negative mania    Discharge Instructions   Allergies as of 06/20/2019      Reactions   Bactrim [sulfamethoxazole-trimethoprim] Other (See Comments)   Per pt: makes her "blood pressure plummet"    Codeine Other (See Comments)   Per pt: makes her feel like she's going to have a heart attack      Medication List    TAKE these medications   Biktarvy 50-200-25 MG Tabs tablet Generic drug: bictegravir-emtricitabine-tenofovir AF Take 1 tablet by mouth daily.      Allergies  Allergen Reactions  . Bactrim [Sulfamethoxazole-Trimethoprim] Other (See Comments)    Per pt: makes her "blood pressure plummet"   . Codeine Other (See Comments)    Per pt: makes her feel like she's going to have a heart attack      The results of significant diagnostics from this hospitalization (including imaging, microbiology, ancillary and laboratory) are listed below for reference.    Significant Diagnostic Studies: CT HEAD WO CONTRAST  Result Date: 06/17/2019 CLINICAL DATA:  Mental status change with unknown cause EXAM: CT HEAD WITHOUT CONTRAST TECHNIQUE: Contiguous axial images were obtained from the base of the skull through the vertex without intravenous contrast. COMPARISON:  None. FINDINGS: Brain: No evidence of acute infarction, hemorrhage, hydrocephalus, extra-axial collection or mass lesion/mass effect. Vascular: No hyperdense vessel or unexpected calcification. Skull: Normal. Negative for fracture or focal  lesion. Sinuses/Orbits: No acute finding. Other: Partial coverage of the maxillary teeth shows multiple cavities. IMPRESSION: Negative CT of the brain. Electronically Signed   By: Marnee SpringJonathon  Watts M.D.   On: 06/17/2019 05:16   DG Chest Port 1 View  Result Date: 06/17/2019 CLINICAL DATA:  Fever EXAM: PORTABLE CHEST 1 VIEW COMPARISON:  None. FINDINGS: The heart size and mediastinal contours are within normal limits. Both lungs are clear. The visualized skeletal structures  are unremarkable. IMPRESSION: No active disease. Electronically Signed   By: Jasmine PangKim  Fujinaga M.D.   On: 06/17/2019 03:55   ECHOCARDIOGRAM COMPLETE  Result Date: 06/17/2019    ECHOCARDIOGRAM REPORT   Patient Name:   Cindy FantasiaICOLE Robertson Date of Exam: 06/17/2019 Medical Rec #:  [id number]        Height:       62.0 in Accession #:    [id number]       Weight:       132.3 lb Date of Birth:  10/15/1980        BSA:          1.604 m Patient Age:    38 years         BP:           129/79 mmHg Patient Gender: F                HR:           81 bpm. Exam Location:  Inpatient Procedure: 2D Echo, Cardiac Doppler and Color Doppler Indications:    Murmur 785.2                 Endocarditis I38  History:        Patient has no prior history of Echocardiogram examinations.  Sonographer:    Renella CunasJulia Swaim RDCS Referring Phys: [id number] Deno LungerGEORGE J [facility] IMPRESSIONS  1. Left ventricular ejection fraction, by estimation, is 55 to 60%. The left ventricle has normal function. The left ventricle has no regional wall motion abnormalities. Left ventricular diastolic parameters were normal.  2. Right ventricular systolic function is normal. The right ventricular size is normal. There is normal pulmonary artery systolic pressure.  3. The mitral valve is normal in structure. No evidence of mitral valve regurgitation. No evidence of mitral stenosis.  4. The aortic valve is normal in structure. Aortic valve regurgitation is not visualized. No aortic stenosis is present.  5. The inferior vena cava is normal in size with greater than 50% respiratory variability, suggesting right atrial pressure of 3 mmHg. Conclusion(s)/Recommendation(s): No evidence of valvular vegetations on this transthoracic echocardiogram. Would recommend a transesophageal echocardiogram to exclude infective endocarditis if clinically indicated. FINDINGS  Left Ventricle: Left ventricular ejection fraction, by estimation, is 55 to 60%. The left ventricle has normal function. The left  ventricle has no regional wall motion abnormalities. The left ventricular internal cavity size was normal in size. There is  no left ventricular hypertrophy. Left ventricular diastolic parameters were normal. Normal left ventricular filling pressure. Right Ventricle: The right ventricular size is normal. No increase in right ventricular wall thickness. Right ventricular systolic function is normal. There is normal pulmonary artery systolic pressure. The tricuspid regurgitant velocity is 1.56 m/s, and  with an assumed right atrial pressure of 3 mmHg, the estimated right ventricular systolic pressure is 12.7 mmHg. Left Atrium: Left atrial size was normal in size. Right Atrium: Right atrial size was normal in size. Pericardium: There is no evidence of pericardial effusion. Mitral Valve: The mitral valve is  normal in structure. Normal mobility of the mitral valve leaflets. No evidence of mitral valve regurgitation. No evidence of mitral valve stenosis. Tricuspid Valve: The tricuspid valve is normal in structure. Tricuspid valve regurgitation is not demonstrated. No evidence of tricuspid stenosis. Aortic Valve: The aortic valve is normal in structure. Aortic valve regurgitation is not visualized. No aortic stenosis is present. Pulmonic Valve: The pulmonic valve was normal in structure. Pulmonic valve regurgitation is not visualized. No evidence of pulmonic stenosis. Aorta: The aortic root is normal in size and structure. Venous: The inferior vena cava is normal in size with greater than 50% respiratory variability, suggesting right atrial pressure of 3 mmHg. IAS/Shunts: No atrial level shunt detected by color flow Doppler.  LEFT VENTRICLE PLAX 2D LVIDd:         4.36 cm  Diastology LVIDs:         3.15 cm  LV e' lateral:   13.40 cm/s LV PW:         0.77 cm  LV E/e' lateral: 5.8 LV IVS:        0.79 cm  LV e' medial:    10.10 cm/s LVOT diam:     1.50 cm  LV E/e' medial:  7.7 LV SV:         36 LV SV Index:   23 LVOT Area:      1.77 cm  RIGHT VENTRICLE RV S prime:     14.70 cm/s TAPSE (M-mode): 1.9 cm LEFT ATRIUM             Index       RIGHT ATRIUM          Index LA diam:        3.50 cm 2.18 cm/m  RA Area:     8.84 cm LA Vol (A2C):   33.1 ml 20.64 ml/m RA Volume:   16.90 ml 10.54 ml/m LA Vol (A4C):   19.1 ml 11.91 ml/m LA Biplane Vol: 26.5 ml 16.52 ml/m  AORTIC VALVE LVOT Vmax:   106.00 cm/s LVOT Vmean:  71.900 cm/s LVOT VTI:    0.205 m  AORTA Ao Root diam: 2.60 cm MITRAL VALVE               TRICUSPID VALVE MV Area (PHT): 4.80 cm    TR Peak grad:   9.7 mmHg MV Decel Time: 158 msec    TR Vmax:        156.00 cm/s MV E velocity: 77.60 cm/s MV A velocity: 69.40 cm/s  SHUNTS MV E/A ratio:  1.12        Systemic VTI:  0.20 m                            Systemic Diam: 1.50 cm Rachelle Hora Croitoru MD Electronically signed by Thurmon Fair MD Signature Date/Time: 06/17/2019/11:26:21 AM    Final     Microbiology: Recent Results (from the past 240 hour(s))  Blood Culture (routine x 2)     Status: None (Preliminary result)   Collection Time: 06/17/19  4:10 AM   Specimen: BLOOD LEFT ARM  Result Value Ref Range Status   Specimen Description BLOOD LEFT ARM  Final   Special Requests   Final    BOTTLES DRAWN AEROBIC AND ANAEROBIC Blood Culture results may not be optimal due to an excessive volume of blood received in culture bottles   Culture   Final    NO GROWTH 2 DAYS  Performed at [facility], [address], [place], [place] [address]    Report Status PENDING  Incomplete  Blood Culture (routine x 2)     Status: None (Preliminary result)   Collection Time: 06/17/19  5:17 AM   Specimen: BLOOD RIGHT ARM  Result Value Ref Range Status   Specimen Description BLOOD RIGHT ARM  Final   Special Requests   Final    BOTTLES DRAWN AEROBIC AND ANAEROBIC Blood Culture adequate volume   Culture   Final    NO GROWTH 2 DAYS Performed at [facility], [address], [place], [place] [address]    Report Status PENDING  Incomplete   Urine culture     Status: None   Collection Time: 06/17/19  6:07 AM   Specimen: In/Out Cath Urine  Result Value Ref Range Status   Specimen Description IN/OUT CATH URINE  Final   Special Requests NONE  Final   Culture   Final    NO GROWTH Performed at [facility], [address], [place], [place] [address]    Report Status 06/18/2019 FINAL  Final  CSF culture     Status: None   Collection Time: 06/17/19  6:09 AM   Specimen: Back; Cerebrospinal Fluid  Result Value Ref Range Status   Specimen Description BACK  Final   Special Requests NONE  Final   Gram Stain CYTOSPIN SMEAR NO WBC SEEN NO ORGANISMS SEEN   Final   Culture   Final    NO GROWTH 3 DAYS Performed at [facility], [address], [place], [place] [address]    Report Status 06/20/2019 FINAL  Final  SARS CORONAVIRUS 2 (TAT 6-24 HRS) Nasopharyngeal Nasopharyngeal Swab     Status: None   Collection Time: 06/17/19  7:58 AM   Specimen: Nasopharyngeal Swab  Result Value Ref Range Status   SARS Coronavirus 2 NEGATIVE NEGATIVE Final    Comment: (NOTE) SARS-CoV-2 target nucleic acids are NOT DETECTED. The SARS-CoV-2 RNA is generally detectable in upper and lower respiratory specimens during the acute phase of infection. Negative results do not preclude SARS-CoV-2 infection, do not rule out co-infections with other pathogens, and should not be used as the sole basis for treatment or other patient management decisions. Negative results must be combined with clinical observations, patient history, and epidemiological information. The expected result is Negative. Fact Sheet for Patients: HairSlick.no Fact Sheet for Healthcare Providers: quierodirigir.com This test is not yet approved or cleared by the [place] FDA and  has been authorized for detection and/or diagnosis of SARS-CoV-2 by FDA under an Emergency Use Authorization (EUA). This EUA will remain   in effect (meaning this test can be used) for the duration of the COVID-19 declaration under Section 56 4(b)(1) of the Act, 21 U.S.C. section 360bbb-3(b)(1), unless the authorization is terminated or revoked sooner. Performed at [facility], [address], [place], [place] [address]      Labs: Basic Metabolic Panel: Recent Labs  Lab 06/17/19 0408 06/17/19 0917 06/18/19 0328 06/18/19 2352 06/20/19 0337  NA 138  --  137 138 139  K 2.9*  --  4.3 4.3 4.2  CL 103  --  108 110 108  CO2 22  --  23 19* 22  GLUCOSE 99  --  125* 85 105*  BUN 12  --  7 5* 8  CREATININE 1.02* 0.69 0.64 0.56 0.63  CALCIUM 8.7*  --  7.7* 8.4* 8.6*  MG  --  1.8 1.8 1.6* 1.8  PHOS  --   --   --  2.9 4.0   Liver Function Tests: Recent Labs  Lab 06/17/19 0408 06/18/19 0328 06/18/19 2352 06/20/19 0337  AST 37 28 25 27   ALT 28 22 21 27   ALKPHOS 43 32* 29* 37*  BILITOT 1.0 0.6 0.4 0.3  PROT 8.1 5.8* 6.2* 6.9  ALBUMIN 3.8 2.4* 2.6* 3.0*   No results for input(s): LIPASE, AMYLASE in the last 168 hours. No results for input(s): AMMONIA in the last 168 hours. CBC: Recent Labs  Lab 06/17/19 0408 06/17/19 0917 06/18/19 0328 06/18/19 2352 06/20/19 0337  WBC 9.1 6.3 3.3* 3.5* 4.7  NEUTROABS 7.6  --  1.6*  --   --   HGB 11.1* 10.3* 10.3* 9.7* 10.9*  HCT 37.1 34.9* 34.5* 33.1* 36.4  MCV 76.8* 77.6* 76.5* 76.3* 76.3*  PLT 305 249 200 204 266   Cardiac Enzymes: Recent Labs  Lab 06/17/19 0408  CKTOTAL 460*   BNP: BNP (last 3 results) No results for input(s): BNP in the last 8760 hours.  ProBNP (last 3 results) No results for input(s): PROBNP in the last 8760 hours.  CBG: No results for input(s): GLUCAP in the last 168 hours.     Signed:  Dia Crawford, MD [facility] [phone] pager

## 2019-06-21 LAB — HEPATITIS B DNA, ULTRAQUANTITATIVE, PCR
HBV DNA SERPL PCR-ACNC: 400 IU/mL
HBV DNA SERPL PCR-LOG IU: 2.602 log10 IU/mL

## 2019-06-21 LAB — HEPATITIS C GENOTYPE: HCV Genotype: 3

## 2019-06-21 LAB — HEPATITIS B E ANTIGEN: Hep B E Ag: NEGATIVE

## 2019-06-22 LAB — CULTURE, BLOOD (ROUTINE X 2)
Culture: NO GROWTH
Culture: NO GROWTH
Special Requests: ADEQUATE

## 2019-06-23 ENCOUNTER — Telehealth: Payer: Self-pay

## 2019-06-23 LAB — HEPATITIS B E ANTIBODY: Hep B E Ab: NEGATIVE

## 2019-06-23 LAB — QUANTIFERON-TB GOLD PLUS

## 2019-06-23 NOTE — Telephone Encounter (Signed)
Left patient a voice mail to call back to set up a hospital follow with Jeanine Luz for the week of the 06/30/19

## 2019-06-23 NOTE — Telephone Encounter (Signed)
-----   Message from Veryl Speak, FNP sent at 06/20/2019  1:20 PM EDT ----- Regarding: Appointment Could we please call Ms. Cindy Robertson and schedule her for an appointment with me the week of the 26th to establish care for HIV.  Thanks!  Tammy Sours

## 2019-06-25 LAB — HLA B*5701: HLA B 5701: NEGATIVE

## 2019-06-26 ENCOUNTER — Telehealth: Payer: Self-pay | Admitting: *Deleted

## 2019-06-26 NOTE — Telephone Encounter (Signed)
Sent referral to [facility]. Endya Austin M, RN  

## 2019-07-01 LAB — HIV-1 RNA, PCR (GRAPH) RFX/GENO EDI
HIV-1 RNA BY PCR: 297000 copies/mL
HIV-1 RNA Quant, Log: 5.473 log10copy/mL

## 2019-07-01 LAB — HIV GENOSURE(R) MG

## 2019-07-01 LAB — REFLEX TO GENOSURE(R) MG EDI: HIV GenoSure(R): 1

## 2019-07-03 ENCOUNTER — Ambulatory Visit: Payer: Self-pay

## 2019-07-03 ENCOUNTER — Inpatient Hospital Stay: Payer: Self-pay | Admitting: Family

## 2019-07-23 ENCOUNTER — Inpatient Hospital Stay: Payer: Self-pay | Admitting: Family

## 2019-07-23 ENCOUNTER — Ambulatory Visit: Payer: Self-pay

## 2019-07-24 ENCOUNTER — Other Ambulatory Visit: Payer: Self-pay

## 2019-07-24 ENCOUNTER — Telehealth: Payer: Self-pay | Admitting: Pharmacy Technician

## 2019-07-24 ENCOUNTER — Ambulatory Visit: Payer: Self-pay

## 2019-07-24 ENCOUNTER — Ambulatory Visit (INDEPENDENT_AMBULATORY_CARE_PROVIDER_SITE_OTHER): Payer: Self-pay | Admitting: Family

## 2019-07-24 ENCOUNTER — Encounter: Payer: Self-pay | Admitting: Family

## 2019-07-24 VITALS — BP 109/70 | HR 70 | Temp 97.9°F | Ht 62.0 in | Wt 126.0 lb

## 2019-07-24 DIAGNOSIS — F1910 Other psychoactive substance abuse, uncomplicated: Secondary | ICD-10-CM

## 2019-07-24 DIAGNOSIS — F330 Major depressive disorder, recurrent, mild: Secondary | ICD-10-CM

## 2019-07-24 DIAGNOSIS — F32A Depression, unspecified: Secondary | ICD-10-CM | POA: Insufficient documentation

## 2019-07-24 DIAGNOSIS — R768 Other specified abnormal immunological findings in serum: Secondary | ICD-10-CM | POA: Insufficient documentation

## 2019-07-24 DIAGNOSIS — B182 Chronic viral hepatitis C: Secondary | ICD-10-CM | POA: Insufficient documentation

## 2019-07-24 DIAGNOSIS — B20 Human immunodeficiency virus [HIV] disease: Secondary | ICD-10-CM

## 2019-07-24 MED ORDER — DULOXETINE HCL 30 MG PO CPEP: 30.0000 mg | ORAL_CAPSULE | Freq: Every day | ORAL | 3 refills | Status: DC

## 2019-07-24 NOTE — Telephone Encounter (Addendum)
[facility] Patient Advocate Encounter    Findings of the benefits investigation:   Insurance: she may have [employer], will need to confirm  [facility] Patient Advocate will follow up once patient arrives for their appointment to confirm the insurance status.  Patient received Biktarvy 06/19/2019 from transitions of care using the [facility] Advancing Access trialcard. Trialcard no longer active as of 07/19/2019  Beulah Gandy, CPhT Specialty Pharmacy Patient [facility] for Infectious Disease Phone: [phone] Fax: [phone] 07/24/2019 9:01 AM

## 2019-07-24 NOTE — Assessment & Plan Note (Signed)
Has relapsed since leaving the hospital.  Discussed community resources and counseling options which she is interested in.  Introduced to [employer] today.  Continue to monitor.

## 2019-07-24 NOTE — Assessment & Plan Note (Signed)
Cindy Robertson has a positive hepatitis B surface antigen with concern for chronic hepatitis B infection with previous hepatitis B viral load of 400.  We will recheck hepatitis B lab work today.  She will be subsequently treated with Biktarvy having tenofovir.  Advised not to abruptly cease medication as this may result in a flare.  Check hepatitis D status today.  Continue current dose of Biktarvy for HIV.

## 2019-07-24 NOTE — Assessment & Plan Note (Signed)
Cindy Robertson is a 29 year old female with newly diagnosed HIV-1 disease with initial CD4 count of 306 and viral load of 165,000.  Risk factors for acquiring HIV includes sexual contact and substance abuse.  Has good adherence and tolerance to her ART regimen of Biktarvy started while in the hospital.  We reviewed previous lab work and discussed the plan of care.  Continue current dose of Biktarvy.  She met with [facility] today for assistance.  Plan for follow-up in 1 month or sooner if needed.

## 2019-07-24 NOTE — Assessment & Plan Note (Signed)
Cindy Robertson is a 29 year old female with genotype 3 chronic hepatitis C likely obtained from IV drug use.  She is treatment nave and currently asymptomatic.  Discussed the pathogenesis, transmission, prevention, risks if left untreated, and treatment options for hepatitis C.  Will need fibrosis testing.  Once HIV stable we will plan for hepatitis C treatment.

## 2019-07-24 NOTE — Patient Instructions (Signed)
Nice to see you.  We will check your blood work today.  Continue to take your [place] daily as prescribed.  Start taking Cymbalta.  Schedule an appointment with our counselor, Marylu Lund.   If needed sooner [facility] is a walk in service.  Plan for follow up in 1 month or sooner if needed.   Have a good day and let us know if you need something!

## 2019-07-24 NOTE — Assessment & Plan Note (Signed)
Ms. Cygan has increased levels of depression secondary to multiple new diagnosis use and less than optimal support from family members.  No suicidal ideations or signs of psychosis at present.  Restart Cymbalta which she has been on in the past.  Recommend counseling and will schedule appointment.  Provided community resources if needed prior to appointment availability.

## 2019-07-24 NOTE — Progress Notes (Signed)
Subjective:    Patient ID: Cindy Robertson, female    DOB: September 05, 1990, 29 y.o.   MRN: [id number]  Chief Complaint  Patient presents with  . New Patient (Initial Visit)    B20    HPI:  Cindy Robertson is a 29 y.o. female with previous medical history of substance use presenting today for hospital follow-up and newly diagnosed HIV-1 disease.  Cindy Robertson was recently hospitalized having been found extremely agitated and acting erratically following methamphetamine use.  Initially thought to have meningitis as she had a fever of 101.4 and required multiple rounds of sedation.  CT of the head was without significant findings.  Lumbar puncture with white blood cell count of 4, protein 35, and glucose of 73.  During routine screening she was noted to have positive HIV-1 testing.  She was last tested in 2016 which was negative.  Cindy Robertson was started on Biktarvy while in the hospital.  Blood work showed initial viral load of 165,000 with CD4 nadir of 306.  Risk factor for acquiring HIV-1 is heterosexual contact and IV drug use.  No history of symptoms associated with acute retroviral syndrome or opportunistic infection.  She obtained a 30-day supply of Biktarvy prior to leaving the hospital.  Cindy Robertson was also diagnosed with genotype 3 hepatitis C with initial viral load of 24,500,000 IU/mL.  Risk factor for obtaining hepatitis C included IV drug use.  Denied any current symptoms including abdominal pain, nausea, vomiting, scleral icterus, or jaundice.  No personal or family history of liver disease.  She does continue to use recreational/illicit substances.  There was concern for possible hepatitis B infection with positive hepatitis B surface antigen.  Hepatitis E antigen and antibody were nonreactive.  Hepatitis DNA level of 400.  Since leaving the hospital Cindy Robertson has been taking her Biktarvy daily as prescribed with no adverse side effects or missed doses.  Overall feeling  well today.  She has increased stressors at home which have contributed to her most recent relapse of substance use.  Family interactions have changed secondary to new diagnosis of HIV-1.    Allergies  Allergen Reactions  . Bactrim [Sulfamethoxazole-Trimethoprim] Other (See Comments)    Per pt: makes her "blood pressure plummet"   . Codeine Other (See Comments)    Per pt: makes her feel like she's going to have a heart attack      Outpatient Medications Prior to Visit  Medication Sig Dispense Refill  . bictegravir-emtricitabine-tenofovir AF (BIKTARVY) 50-200-25 MG TABS tablet Take 1 tablet by mouth daily. 30 tablet 0   No facility-administered medications prior to visit.     No past medical history on file.    No past surgical history on file.    Family History  Family history unknown: Yes      Social History   Socioeconomic History  . Marital status: Single    Spouse name: Not on file  . Number of children: Not on file  . Years of education: Not on file  . Highest education level: Not on file  Occupational History  . Not on file  Tobacco Use  . Smoking status: Current Every Day Smoker    Packs/day: 1.00    Types: Cigarettes  . Smokeless tobacco: Current User  Substance and Sexual Activity  . Alcohol use: Never  . Drug use: Not on file  . Sexual activity: Not on file  Other Topics Concern  . Not on file  Social History Narrative  .  Not on file   Social Determinants of Health   Financial Resource Strain:   . Difficulty of Paying Living Expenses:   Food Insecurity:   . Worried About Charity fundraiser in the Last Year:   . Arboriculturist in the Last Year:   Transportation Needs:   . Film/video editor (Medical):   Marland Kitchen Lack of Transportation (Non-Medical):   Physical Activity:   . Days of Exercise per Week:   . Minutes of Exercise per Session:   Stress:   . Feeling of Stress :   Social Connections:   . Frequency of Communication with  Friends and Family:   . Frequency of Social Gatherings with Friends and Family:   . Attends Religious Services:   . Active Member of Clubs or Organizations:   . Attends Archivist Meetings:   Marland Kitchen Marital Status:   Intimate Partner Violence:   . Fear of Current or Ex-Partner:   . Emotionally Abused:   Marland Kitchen Physically Abused:   . Sexually Abused:       Review of Systems  Constitutional: Negative for appetite change, chills, diaphoresis, fatigue, fever and unexpected weight change.  Eyes:       Negative for acute change in vision  Respiratory: Negative for chest tightness, shortness of breath and wheezing.   Cardiovascular: Negative for chest pain.  Gastrointestinal: Negative for diarrhea, nausea and vomiting.  Genitourinary: Negative for dysuria, pelvic pain and vaginal discharge.  Musculoskeletal: Negative for neck pain and neck stiffness.  Skin: Negative for rash.  Neurological: Negative for seizures, syncope, weakness and headaches.  Hematological: Negative for adenopathy. Does not bruise/bleed easily.  Psychiatric/Behavioral: Negative for hallucinations.       Objective:    BP 109/70   Pulse 70   Temp 97.9 F (36.6 C)   Ht 5' 2"  (1.575 m)   Wt 126 lb (57.2 kg)   SpO2 98%   BMI 23.05 kg/m  Nursing note and vital signs reviewed.  Physical Exam Constitutional:      General: She is not in acute distress.    Appearance: She is well-developed.     Comments: Seated on the exam table; appears older than stated age; tearful at times  Cardiovascular:     Rate and Rhythm: Normal rate and regular rhythm.     Heart sounds: Normal heart sounds.  Pulmonary:     Effort: Pulmonary effort is normal.     Breath sounds: Normal breath sounds.  Skin:    General: Skin is warm and dry.  Neurological:     Mental Status: She is alert and oriented to person, place, and time.  Psychiatric:        Mood and Affect: Mood is anxious and depressed.         Assessment & Plan:     Patient Active Problem List   Diagnosis Date Noted  . Chronic hepatitis C without hepatic coma ([place]) 07/24/2019  . Hepatitis B surface antigen positive 07/24/2019  . Depression 07/24/2019  . HIV (human immunodeficiency virus infection) ([place]) 06/18/2019  . Drug abuse ([place]) 06/18/2019  . Toxic metabolic encephalopathy 57/32/2025  . SIRS (systemic inflammatory response syndrome) ([place]) 06/17/2019  . Hypokalemia 06/17/2019  . Overdose 06/17/2019     Problem List Items Addressed This Visit      Digestive   Chronic hepatitis C without hepatic coma ([place])    Cindy Robertson is a 29 year old female with genotype 3 chronic hepatitis C likely obtained from  IV drug use.  She is treatment nave and currently asymptomatic.  Discussed the pathogenesis, transmission, prevention, risks if left untreated, and treatment options for hepatitis C.  Will need fibrosis testing.  Once HIV stable we will plan for hepatitis C treatment.        Other   HIV (human immunodeficiency virus infection) ([place]) - Primary    Cindy Robertson is a 29 year old female with newly diagnosed HIV-1 disease with initial CD4 count of 306 and viral load of 165,000.  Risk factors for acquiring HIV includes sexual contact and substance abuse.  Has good adherence and tolerance to her ART regimen of Biktarvy started while in the hospital.  We reviewed previous lab work and discussed the plan of care.  Continue current dose of Biktarvy.  She met with [facility] today for assistance.  Plan for follow-up in 1 month or sooner if needed.      Relevant Orders   HIV-1 RNA quant-no reflex-bld   T-helper cell (CD4)- ([facility] clinic only)   COMPLETE METABOLIC PANEL WITH GFR   Drug abuse ([place])    Has relapsed since leaving the hospital.  Discussed community resources and counseling options which she is interested in.  Introduced to [employer] today.  Continue to monitor.      Hepatitis B surface antigen positive    Cindy Robertson has a positive  hepatitis B surface antigen with concern for chronic hepatitis B infection with previous hepatitis B viral load of 400.  We will recheck hepatitis B lab work today.  She will be subsequently treated with Biktarvy having tenofovir.  Advised not to abruptly cease medication as this may result in a flare.  Check hepatitis D status today.  Continue current dose of Biktarvy for HIV.      Depression    Cindy Robertson has increased levels of depression secondary to multiple new diagnosis use and less than optimal support from family members.  No suicidal ideations or signs of psychosis at present.  Restart Cymbalta which she has been on in the past.  Recommend counseling and will schedule appointment.  Provided community resources if needed prior to appointment availability.      Relevant Medications   DULoxetine (CYMBALTA) 30 MG capsule    Other Visit Diagnoses    Chronic viral hepatitis B without delta agent and without coma (HCC)       Relevant Orders   Hepatitis B core antibody, IgM   Hepatitis B core antibody, total   Hepatitis B surface antibody,qualitative   Hepatitis B surface antigen   Hepatitis B e antigen   Hepatitis B e antibody   Hepatitis B DNA, ultraquantitative, PCR   Hepatitis delta antibody   Liver Fibrosis, FibroTest-ActiTest       I am having Cindy Robertson start on DULoxetine. I am also having her maintain her Biktarvy.   Meds ordered this encounter  Medications  . DULoxetine (CYMBALTA) 30 MG capsule    Sig: Take 1 capsule (30 mg total) by mouth daily.    Dispense:  30 capsule    Refill:  3    Order Specific Question:   Supervising Provider    Answer:   Carlyle Basques [[id number]]     Follow-up: No follow-ups on file.    Terri Piedra, MSN, FNP-C Nurse Practitioner [facility] for Infectious Disease [place] number: [phone]

## 2019-07-25 LAB — T-HELPER CELL (CD4) - (RCID CLINIC ONLY)
CD4 % Helper T Cell: 42 % (ref 33–65)
CD4 T Cell Abs: 441 /uL (ref 400–1790)

## 2019-08-07 ENCOUNTER — Ambulatory Visit: Payer: Self-pay

## 2019-08-08 LAB — LIVER FIBROSIS, FIBROTEST-ACTITEST
ALT: 35 U/L — ABNORMAL HIGH (ref 6–29)
Alpha-2-Macroglobulin: 326 mg/dL — ABNORMAL HIGH (ref 106–279)
Apolipoprotein A1: 109 mg/dL (ref 101–198)
Bilirubin: 0.5 mg/dL (ref 0.2–1.2)
Fibrosis Score: 0.36
GGT: 34 U/L (ref 3–40)
Haptoglobin: 102 mg/dL (ref 43–212)
Necroinflammat ACT Score: 0.2
Reference ID: 3415751

## 2019-08-08 LAB — HEPATITIS B CORE ANTIBODY, TOTAL: Hep B Core Total Ab: NONREACTIVE

## 2019-08-08 LAB — COMPLETE METABOLIC PANEL WITH GFR
AG Ratio: 1.1 (calc) (ref 1.0–2.5)
ALT: 35 U/L — ABNORMAL HIGH (ref 6–29)
AST: 36 U/L — ABNORMAL HIGH (ref 10–30)
Albumin: 3.9 g/dL (ref 3.6–5.1)
Alkaline phosphatase (APISO): 69 U/L (ref 31–125)
BUN: 13 mg/dL (ref 7–25)
CO2: 26 mmol/L (ref 20–32)
Calcium: 9.4 mg/dL (ref 8.6–10.2)
Chloride: 101 mmol/L (ref 98–110)
Creat: 0.8 mg/dL (ref 0.50–1.10)
GFR, Est African American: 117 mL/min/{1.73_m2} (ref >=60)
GFR, Est Non African American: 101 mL/min/{1.73_m2} (ref >=60)
Globulin: 3.6 g/dL (calc) (ref 1.9–3.7)
Glucose, Bld: 92 mg/dL (ref 65–99)
Potassium: 4.1 mmol/L (ref 3.5–5.3)
Sodium: 137 mmol/L (ref 135–146)
Total Bilirubin: 0.5 mg/dL (ref 0.2–1.2)
Total Protein: 7.5 g/dL (ref 6.1–8.1)

## 2019-08-08 LAB — HIV-1 RNA QUANT-NO REFLEX-BLD
HIV 1 RNA Quant: 66 copies/mL — ABNORMAL HIGH
HIV-1 RNA Quant, Log: 1.82 Log copies/mL — ABNORMAL HIGH

## 2019-08-08 LAB — HEPATITIS B SURFACE ANTIBODY,QUALITATIVE: Hep B S Ab: BORDERLINE — AB

## 2019-08-08 LAB — HEPATITIS B E ANTIBODY: Hep B E Ab: NONREACTIVE

## 2019-08-08 LAB — HEPATITIS B DNA, ULTRAQUANTITATIVE, PCR
Hepatitis B DNA (Calc): <1 Log IU/mL
Hepatitis B DNA: <10 IU/mL

## 2019-08-08 LAB — HEPATITIS B SURFACE ANTIGEN: Hepatitis B Surface Ag: NONREACTIVE

## 2019-08-08 LAB — HEPATITIS DELTA ANTIBODY: Hepatitis D Ab, Total: NEGATIVE

## 2019-08-08 LAB — HEPATITIS B CORE ANTIBODY, IGM: Hep B C IgM: NONREACTIVE

## 2019-08-08 LAB — HEPATITIS B E ANTIGEN: Hep B E Ag: NONREACTIVE

## 2019-08-13 ENCOUNTER — Other Ambulatory Visit: Payer: Self-pay

## 2019-08-13 DIAGNOSIS — B20 Human immunodeficiency virus [HIV] disease: Secondary | ICD-10-CM

## 2019-08-13 MED ORDER — BIKTARVY 50-200-25 MG PO TABS: 1.0000 | ORAL_TABLET | Freq: Every day | ORAL | 0 refills | Status: DC

## 2019-08-14 ENCOUNTER — Other Ambulatory Visit: Payer: Self-pay

## 2019-08-14 DIAGNOSIS — B20 Human immunodeficiency virus [HIV] disease: Secondary | ICD-10-CM

## 2019-08-14 MED ORDER — BIKTARVY 50-200-25 MG PO TABS: 1.0000 | ORAL_TABLET | Freq: Every day | ORAL | 0 refills | Status: DC

## 2019-08-18 ENCOUNTER — Telehealth: Payer: Self-pay

## 2019-08-18 NOTE — Telephone Encounter (Signed)
COVID-19 Pre-Screening Questions:08/18/19  Do you currently have a fever (>100 F), chills or unexplained body aches?NO  . Are you currently experiencing new cough, shortness of breath, sore throat, runny nose?NO .  Have you recently travelled outside the state of [place] in the last 14 days?NO .  Have you been in contact with someone that is currently pending confirmation of Covid19 testing or has been confirmed to have the Covid19 virus? NO  **If the patient answers NO to ALL questions -  advise the patient to please call the clinic before coming to the office should any symptoms develop.     

## 2019-08-19 ENCOUNTER — Other Ambulatory Visit: Payer: Self-pay

## 2019-08-19 ENCOUNTER — Telehealth: Payer: Self-pay | Admitting: Pharmacy Technician

## 2019-08-19 ENCOUNTER — Ambulatory Visit (INDEPENDENT_AMBULATORY_CARE_PROVIDER_SITE_OTHER): Payer: Self-pay | Admitting: Family

## 2019-08-19 ENCOUNTER — Encounter: Payer: Self-pay | Admitting: Family

## 2019-08-19 VITALS — BP 121/72 | HR 60 | Temp 98.7°F | Wt 123.0 lb

## 2019-08-19 DIAGNOSIS — B182 Chronic viral hepatitis C: Secondary | ICD-10-CM

## 2019-08-19 DIAGNOSIS — F330 Major depressive disorder, recurrent, mild: Secondary | ICD-10-CM

## 2019-08-19 DIAGNOSIS — B20 Human immunodeficiency virus [HIV] disease: Secondary | ICD-10-CM

## 2019-08-19 MED ORDER — BIKTARVY 50-200-25 MG PO TABS: 1.0000 | ORAL_TABLET | Freq: Every day | ORAL | 0 refills | Status: DC

## 2019-08-19 MED ORDER — DULOXETINE HCL 30 MG PO CPEP: 30.0000 mg | ORAL_CAPSULE | Freq: Every day | ORAL | 3 refills | Status: DC

## 2019-08-19 NOTE — Patient Instructions (Signed)
Nice to see you.  We will check your blood work today.  Continue to take your Biktarvy and Cymbalta.  Schedule an appointment with Marylu Lund for counseling.  We will get you started on treatment for Hepatitis C.  It does not appear that you have Hepatitis B or have cleared the infection.  Plan for follow up in 6 weeks or sooner if needed.  Have a great day and stay safe!

## 2019-08-19 NOTE — Assessment & Plan Note (Signed)
Ms. Gores continues to have good adherence and tolerance to her ART regimen of Biktarvy.  No signs/symptoms of opportunistic infection or progressive HIV disease.  Reviewed previous lab work and discussed plan of care.  Check blood work today.  Continue current dose of Biktarvy.  Plan for follow-up in 6 weeks or sooner if needed.

## 2019-08-19 NOTE — Assessment & Plan Note (Signed)
Cindy Robertson is has symptoms concerning for depression and is scheduled to see our counselor Marylu Lund. She has no suicidal ideations or signs of psychosis at present. Will continue current dose of cymbalta.

## 2019-08-19 NOTE — Telephone Encounter (Signed)
[facility] Patient Advocate Encounter  Gathered information and signatures on applications for Hepatitis C medication patient assistance.  Will hold until medication is written.  Netty Starring. Dimas Aguas CPhT Specialty Pharmacy Patient [facility] for Infectious Disease Phone: [phone] Fax:  [phone]

## 2019-08-19 NOTE — Progress Notes (Signed)
Subjective:    Patient ID: Cindy Robertson, female    DOB: 1990/12/10, 29 y.o.   MRN: [id number]  Chief Complaint  Patient presents with  . Follow-up    Pt stated--nausea, dizzy, and sometimes run low fever at night--worse before      HPI:  Cindy Robertson is a 29 y.o. female with HIV disease and hepatitis C who was most recently seen in the office on 07/24/2019 following hospitalization with newly diagnosed HIV disease.  Started on Biktarvy at the time with a viral load initially of 297,000 and initial CD4 count of 306.  On follow-up her blood work showed a viral load of 66 and CD4 count of 441.  Previous blood work also revealed negative hepatitis B status.  Here today for routine follow-up.  Cindy Robertson has been taking her Biktarvy as prescribed daily with no adverse side effects or missed doses since her last office visit.  She has been having pain located in her bilateral hands resulting in mild edema after working for a couple hours recently.  No significant trauma or injury that she can recall.  She does have numbness and tingling on occasion. Denies fevers, chills, night sweats, headaches, changes in vision, neck pain/stiffness, nausea, diarrhea, vomiting, lesions or rashes.  Cindy Robertson has no problems obtaining her medication from the pharmacy.  She does have trouble sleeping at night on occasion and has feelings of being down and lack of energy/desire. No suicidal ideations. No current recreational or illicit drug use or alcohol consumption. Continues to smoke tobacco. Interested in applying for disability.   Allergies  Allergen Reactions  . Bactrim [Sulfamethoxazole-Trimethoprim] Other (See Comments)    Per pt: makes her "blood pressure plummet"   . Codeine Other (See Comments)    Per pt: makes her feel like she's going to have a heart attack      Outpatient Medications Prior to Visit  Medication Sig Dispense Refill  . bictegravir-emtricitabine-tenofovir AF  (BIKTARVY) 50-200-25 MG TABS tablet Take 1 tablet by mouth daily. 30 tablet 0  . DULoxetine (CYMBALTA) 30 MG capsule Take 1 capsule (30 mg total) by mouth daily. 30 capsule 3   No facility-administered medications prior to visit.     History reviewed. No pertinent past medical history.   History reviewed. No pertinent surgical history.   Review of Systems  Constitutional: Negative for chills, diaphoresis, fatigue and fever.  Respiratory: Negative for cough, chest tightness, shortness of breath and wheezing.   Cardiovascular: Negative for chest pain.  Gastrointestinal: Positive for nausea. Negative for abdominal pain, diarrhea and vomiting.  Musculoskeletal:       Positive for bilateral hand pain.       Objective:    BP 121/72   Pulse 60   Temp 98.7 F (37.1 C)   Wt 123 lb (55.8 kg)   SpO2 100%   BMI 22.50 kg/m  Nursing note and vital signs reviewed.  Physical Exam Constitutional:      General: She is not in acute distress.    Appearance: She is well-developed.     Comments: Seated on the exam table; pleasant; sad.   Eyes:     Conjunctiva/sclera: Conjunctivae normal.  Cardiovascular:     Rate and Rhythm: Normal rate and regular rhythm.     Heart sounds: Normal heart sounds. No murmur heard.  No friction rub. No gallop.   Pulmonary:     Effort: Pulmonary effort is normal. No respiratory distress.     Breath sounds: Normal  breath sounds. No wheezing or rales.  Chest:     Chest wall: No tenderness.  Abdominal:     General: Bowel sounds are normal.     Palpations: Abdomen is soft.     Tenderness: There is no abdominal tenderness.  Musculoskeletal:     Cervical back: Neck supple.  Lymphadenopathy:     Cervical: No cervical adenopathy.  Skin:    General: Skin is warm and dry.     Findings: No rash.  Neurological:     Mental Status: She is alert and oriented to person, place, and time.  Psychiatric:        Behavior: Behavior normal.        Thought Content:  Thought content normal.        Judgment: Judgment normal.      No flowsheet data found.     Assessment & Plan:    Patient Active Problem List   Diagnosis Date Noted  . Chronic hepatitis C without hepatic coma (HCC) 07/24/2019  . Hepatitis B surface antigen positive 07/24/2019  . Depression 07/24/2019  . HIV (human immunodeficiency virus infection) (HCC) 06/18/2019  . Drug abuse (HCC) 06/18/2019  . Toxic metabolic encephalopathy 06/17/2019  . SIRS (systemic inflammatory response syndrome) (HCC) 06/17/2019  . Hypokalemia 06/17/2019  . Overdose 06/17/2019     Problem List Items Addressed This Visit      Digestive   Chronic hepatitis C without hepatic coma (HCC)    Cindy Robertson has genotype 3 chronic hepatitis C with initial viral load of 24.5 million and fibrosis score of F1 to F2.  Discussed the pathogenesis, risks if left untreated, and treatment options for hepatitis C. Will plan for 8 weeks with Mavyret which will not interact with her Biktarvy.       Relevant Medications   bictegravir-emtricitabine-tenofovir AF (BIKTARVY) 50-200-25 MG TABS tablet     Other   HIV (human immunodeficiency virus infection) (HCC)    Cindy Robertson continues to have good adherence and tolerance to her ART regimen of Biktarvy.  No signs/symptoms of opportunistic infection or progressive HIV disease.  Reviewed previous lab work and discussed plan of care.  Check blood work today.  Continue current dose of Biktarvy.  Plan for follow-up in 6 weeks or sooner if needed.      Relevant Medications   bictegravir-emtricitabine-tenofovir AF (BIKTARVY) 50-200-25 MG TABS tablet   Other Relevant Orders   COMPLETE METABOLIC PANEL WITH GFR   T-helper cell (CD4)- ([facility] clinic only)   HIV-1 RNA quant-no reflex-bld   Hepatitis B surface antibody,qualitative   Depression - Primary    Cindy Robertson is has symptoms concerning for depression and is scheduled to see our counselor Marylu Lund. She has no suicidal  ideations or signs of psychosis at present. Will continue current dose of cymbalta.       Relevant Medications   DULoxetine (CYMBALTA) 30 MG capsule       I am having Cindy Robertson maintain her Biktarvy and DULoxetine.   Meds ordered this encounter  Medications  . bictegravir-emtricitabine-tenofovir AF (BIKTARVY) 50-200-25 MG TABS tablet    Sig: Take 1 tablet by mouth daily.    Dispense:  30 tablet    Refill:  0    Order Specific Question:   Supervising Provider    Answer:   Judyann Munson [[id number]]  . DULoxetine (CYMBALTA) 30 MG capsule    Sig: Take 1 capsule (30 mg total) by mouth daily.    Dispense:  30 capsule  Refill:  3    Order Specific Question:   Supervising Provider    Answer:   Carlyle Basques [[id number]]     Follow-up: Return in about 6 weeks (around 09/30/2019), or if symptoms worsen or fail to improve.   Terri Piedra, MSN, FNP-C Nurse Practitioner [facility] for Infectious Disease [place] number: [phone]

## 2019-08-19 NOTE — Assessment & Plan Note (Signed)
Ms. Sparkman has genotype 3 chronic hepatitis C with initial viral load of 24.5 million and fibrosis score of F1 to F2.  Discussed the pathogenesis, risks if left untreated, and treatment options for hepatitis C. Will plan for 8 weeks with Mavyret which will not interact with her Biktarvy.

## 2019-08-20 ENCOUNTER — Telehealth: Payer: Self-pay | Admitting: Pharmacy Technician

## 2019-08-20 LAB — T-HELPER CELL (CD4) - (RCID CLINIC ONLY)
CD4 % Helper T Cell: 33 % (ref 33–65)
CD4 T Cell Abs: 541 /uL (ref 400–1790)

## 2019-08-20 NOTE — Telephone Encounter (Addendum)
[facility] Patient Advocate Encounter  Completed and sent [employer] application for Mavyret 8 weeks for this patient who is uninsured.    She is approved.  Their pharmacy will reach out to her to ship directly to her home.  We will continue to follow.  Netty Starring. Dimas Aguas CPhT Specialty Pharmacy Patient [facility] for Infectious Disease Phone: [phone] Fax:  [phone]

## 2019-08-21 LAB — COMPLETE METABOLIC PANEL WITH GFR
AG Ratio: 1.3 (calc) (ref 1.0–2.5)
ALT: 29 U/L (ref 6–29)
AST: 26 U/L (ref 10–30)
Albumin: 3.5 g/dL — ABNORMAL LOW (ref 3.6–5.1)
Alkaline phosphatase (APISO): 48 U/L (ref 31–125)
BUN: 7 mg/dL (ref 7–25)
CO2: 23 mmol/L (ref 20–32)
Calcium: 8.5 mg/dL — ABNORMAL LOW (ref 8.6–10.2)
Chloride: 106 mmol/L (ref 98–110)
Creat: 0.97 mg/dL (ref 0.50–1.10)
GFR, Est African American: 93 mL/min/{1.73_m2} (ref >=60)
GFR, Est Non African American: 80 mL/min/{1.73_m2} (ref >=60)
Globulin: 2.8 g/dL (calc) (ref 1.9–3.7)
Glucose, Bld: 85 mg/dL (ref 65–99)
Potassium: 4.1 mmol/L (ref 3.5–5.3)
Sodium: 138 mmol/L (ref 135–146)
Total Bilirubin: 0.3 mg/dL (ref 0.2–1.2)
Total Protein: 6.3 g/dL (ref 6.1–8.1)

## 2019-08-21 LAB — HIV-1 RNA QUANT-NO REFLEX-BLD
HIV 1 RNA Quant: 53 copies/mL — ABNORMAL HIGH
HIV-1 RNA Quant, Log: 1.72 Log copies/mL — ABNORMAL HIGH

## 2019-08-21 LAB — HEPATITIS B SURFACE ANTIBODY,QUALITATIVE: Hep B S Ab: BORDERLINE — AB

## 2019-08-26 ENCOUNTER — Telehealth: Payer: Self-pay | Admitting: Pharmacy Technician

## 2019-08-26 NOTE — Telephone Encounter (Signed)
[facility] Patient Advocate Encounter  Spoke with patient about talking with the pharmacy to get Mavyret medication shipped to her home.  She said she already had the phone number.  She sounded very sleepy.  I let her know I will call her back to see if she received and started the medication so we can get her start date and a follow up appointment.  We will continue to follow.  Netty Starring. Dimas Aguas CPhT Specialty Pharmacy Patient [facility] for Infectious Disease Phone: [phone] Fax:  [phone]

## 2019-09-01 ENCOUNTER — Encounter: Payer: Self-pay | Admitting: Pharmacy Technician

## 2019-09-01 ENCOUNTER — Telehealth: Payer: Self-pay | Admitting: Pharmacist

## 2019-09-01 MED ORDER — MAVYRET 100-40 MG PO TABS: 3.0000 | ORAL_TABLET | Freq: Every day | ORAL | 1 refills | Status: DC

## 2019-09-01 NOTE — Telephone Encounter (Signed)
Patient is approved to receive Mavyret x 8 weeks for chronic Hepatitis C infection. Counseled patient to take all three tablets of Mavyret daily with food.  Counseled patient the need to take all three tablets together and to not separate them out during the day. Encouraged patient not to miss any doses and explained how their chance of cure could go down with each dose missed. Counseled patient on what to do if dose is missed - if it is closer to the missed dose take immediately; if closer to next dose then skip dose and take the next dose at the usual time.   Counseled patient on common side effects such as headache, fatigue, and nausea and that these normally decrease with time. I reviewed patient medications and found no drug interactions. Discussed with patient that there are several drug interactions with Mavyret and instructed patient to call the clinic if she wishes to start a new medication during course of therapy. Patient also takes Biktarvy and is having some trouble with nausea. She mostly takes it on an empty stomach. I advised patient to start taking Biktarvy and Mavyret together with the biggest meal of the day (dinner). If she is still having nausea or starts to vomit, I instructed her to call me early next week and I can prescribe some zofran for her. Also advised patient to call if she experiences any other side effects. Patient will follow-up with Tammy Sours on 7/23.

## 2019-09-02 ENCOUNTER — Ambulatory Visit: Payer: Self-pay

## 2019-09-10 ENCOUNTER — Telehealth: Payer: Self-pay

## 2019-09-10 DIAGNOSIS — K30 Functional dyspepsia: Secondary | ICD-10-CM

## 2019-09-10 DIAGNOSIS — R110 Nausea: Secondary | ICD-10-CM

## 2019-09-10 MED ORDER — OMEPRAZOLE 20 MG PO CPDR: 20.0000 mg | DELAYED_RELEASE_CAPSULE | Freq: Every day | ORAL | 2 refills | Status: DC

## 2019-09-10 MED ORDER — ONDANSETRON 4 MG PO TBDP: 4.0000 mg | ORAL_TABLET | Freq: Three times a day (TID) | ORAL | 2 refills | Status: DC | PRN

## 2019-09-10 NOTE — Telephone Encounter (Signed)
Received call from pharmacy. Patient states she is having nausea/indigestion. Per last visit with [name] patient was instructed to call the office if she was still experiencing nausea. Pharmacy instructed patient to make provider aware during upcoming office visit.  Cindy Robertson

## 2019-09-10 NOTE — Addendum Note (Signed)
Addended by: Robinette Haines on: 09/10/2019 04:29 PM   Modules accepted: Orders

## 2019-09-10 NOTE — Telephone Encounter (Signed)
Called patient back and she is still experiencing nausea. She also says her grandmother's omeprazole helps some too. Will send in some zofran for her to take 30 minutes prior to taking Biktarvy and Mavyret and also some omeprazole to help with the indigestion.  Sent Rx's to [facility] per patient's request.

## 2019-09-26 ENCOUNTER — Ambulatory Visit: Payer: Self-pay | Admitting: Family

## 2019-10-02 ENCOUNTER — Ambulatory Visit: Payer: Self-pay

## 2019-10-07 ENCOUNTER — Telehealth: Payer: Self-pay | Admitting: *Deleted

## 2019-10-07 NOTE — Telephone Encounter (Signed)
Patient called to reschedule her missed visits. RN explained that she needs to keep this upcoming appointment or let us know ahead of time if she cannot keep it, or she will need to follow up with pharmacy. Rn scheduled patient for appointment with Tammy Sours and Marylu Lund same day (transportation issues). Andree Coss, RN

## 2019-10-13 ENCOUNTER — Other Ambulatory Visit: Payer: Self-pay | Admitting: Family

## 2019-10-13 DIAGNOSIS — B20 Human immunodeficiency virus [HIV] disease: Secondary | ICD-10-CM

## 2019-10-21 ENCOUNTER — Ambulatory Visit: Payer: Self-pay

## 2019-10-21 ENCOUNTER — Ambulatory Visit: Payer: Self-pay | Admitting: Family

## 2019-11-13 ENCOUNTER — Ambulatory Visit: Payer: Self-pay | Admitting: Family

## 2019-11-13 ENCOUNTER — Other Ambulatory Visit: Payer: Self-pay | Admitting: Family

## 2019-11-13 ENCOUNTER — Ambulatory Visit: Payer: Self-pay

## 2019-11-13 DIAGNOSIS — B20 Human immunodeficiency virus [HIV] disease: Secondary | ICD-10-CM

## 2019-12-25 ENCOUNTER — Other Ambulatory Visit: Payer: Self-pay | Admitting: Family

## 2019-12-25 DIAGNOSIS — B20 Human immunodeficiency virus [HIV] disease: Secondary | ICD-10-CM

## 2020-02-17 ENCOUNTER — Ambulatory Visit: Payer: Self-pay | Admitting: Pharmacist

## 2020-02-17 ENCOUNTER — Ambulatory Visit: Payer: Self-pay

## 2020-02-19 ENCOUNTER — Telehealth: Payer: Self-pay

## 2020-02-19 NOTE — Telephone Encounter (Signed)
Patient left a voicemail about getting an appointment scheduled, tried calling the number on the chart.. Number is no longer in service.

## 2020-02-26 ENCOUNTER — Ambulatory Visit: Payer: Self-pay | Admitting: Pharmacist

## 2020-02-26 ENCOUNTER — Encounter: Payer: Self-pay | Admitting: Family

## 2020-02-26 ENCOUNTER — Other Ambulatory Visit: Payer: Self-pay

## 2020-02-26 ENCOUNTER — Ambulatory Visit: Payer: Self-pay

## 2020-02-26 ENCOUNTER — Other Ambulatory Visit: Payer: Self-pay | Admitting: Pharmacist

## 2020-02-26 ENCOUNTER — Encounter: Payer: Self-pay | Admitting: Pharmacist

## 2020-02-26 DIAGNOSIS — B20 Human immunodeficiency virus [HIV] disease: Secondary | ICD-10-CM

## 2020-02-26 DIAGNOSIS — B182 Chronic viral hepatitis C: Secondary | ICD-10-CM

## 2020-02-26 LAB — T-HELPER CELL (CD4) - (RCID CLINIC ONLY)
CD4 % Helper T Cell: 37 % (ref 33–65)
CD4 T Cell Abs: 577 /uL (ref 400–1790)

## 2020-02-26 NOTE — Progress Notes (Signed)
Medication Samples have been provided to the patient.  Drug name: Biktarvy        Strength: 50/200/25 mg       Qty: 28 tablets; 4 bottles   LOT: [id number]   Exp.Date: 04/25/22  Dosing instructions: Take one tablet by mouth once daily  The patient has been instructed regarding the correct time, dose, and frequency of taking this medication, including desired effects and most common side effects.   Tomy Khim L. Elyssa Pendelton, PharmD, BCIDP, AAHIVP, CPP Clinical Pharmacist Practitioner Infectious Diseases Clinical Pharmacist [facility] 02/16/2020, 10:07 AM  

## 2020-03-10 LAB — COMPREHENSIVE METABOLIC PANEL
AG Ratio: 1.4 (calc) (ref 1.0–2.5)
ALT: 12 U/L (ref 6–29)
AST: 14 U/L (ref 10–30)
Albumin: 4.2 g/dL (ref 3.6–5.1)
Alkaline phosphatase (APISO): 101 U/L (ref 31–125)
BUN: 11 mg/dL (ref 7–25)
CO2: 26 mmol/L (ref 20–32)
Calcium: 9.6 mg/dL (ref 8.6–10.2)
Chloride: 105 mmol/L (ref 98–110)
Creat: 0.66 mg/dL (ref 0.50–1.10)
Globulin: 3.1 g/dL (calc) (ref 1.9–3.7)
Glucose, Bld: 79 mg/dL (ref 65–99)
Potassium: 4.2 mmol/L (ref 3.5–5.3)
Sodium: 139 mmol/L (ref 135–146)
Total Bilirubin: 0.4 mg/dL (ref 0.2–1.2)
Total Protein: 7.3 g/dL (ref 6.1–8.1)

## 2020-03-10 LAB — HEPATITIS C RNA QUANTITATIVE
HCV Quantitative Log: <1.18 log IU/mL
HCV RNA, PCR, QN: <15 IU/mL

## 2020-03-10 LAB — HIV-1 RNA QUANT-NO REFLEX-BLD
HIV 1 RNA Quant: <20 Copies/mL
HIV-1 RNA Quant, Log: <1.3 Log cps/mL

## 2020-04-01 ENCOUNTER — Ambulatory Visit: Payer: Self-pay

## 2020-04-01 ENCOUNTER — Ambulatory Visit: Payer: Self-pay | Admitting: Family

## 2020-04-09 ENCOUNTER — Other Ambulatory Visit: Payer: Self-pay | Admitting: Pharmacist

## 2020-04-09 ENCOUNTER — Other Ambulatory Visit: Payer: Self-pay | Admitting: Family

## 2020-04-09 ENCOUNTER — Other Ambulatory Visit: Payer: Self-pay | Admitting: Internal Medicine

## 2020-04-09 DIAGNOSIS — K30 Functional dyspepsia: Secondary | ICD-10-CM

## 2020-04-09 DIAGNOSIS — B20 Human immunodeficiency virus [HIV] disease: Secondary | ICD-10-CM

## 2020-04-09 DIAGNOSIS — R110 Nausea: Secondary | ICD-10-CM

## 2020-04-16 ENCOUNTER — Telehealth: Payer: Self-pay

## 2020-04-16 NOTE — Telephone Encounter (Signed)
Pharmacy called reporting inability to reach patient with phone number listed in chart. They're trying to reach her to schedule delivery of her BIktarvy and additional information. Patient has appointment scheduled on 2/25 with Marcos Eke.   Ab Leaming [facility], RN

## 2020-04-30 ENCOUNTER — Ambulatory Visit: Payer: Self-pay | Admitting: Family

## 2020-04-30 ENCOUNTER — Telehealth: Payer: Self-pay

## 2020-04-30 NOTE — Telephone Encounter (Signed)
CMA attempted to call patient regarding today's missed appointment. Not able to reach patient at this time. Voicemail is not set up.  Can offer virtual appointment today or reschedule for in person appointment.

## 2020-05-20 ENCOUNTER — Other Ambulatory Visit: Payer: Self-pay | Admitting: Family

## 2020-05-20 DIAGNOSIS — R110 Nausea: Secondary | ICD-10-CM

## 2020-05-20 DIAGNOSIS — B20 Human immunodeficiency virus [HIV] disease: Secondary | ICD-10-CM

## 2020-05-20 MED ORDER — BIKTARVY 50-200-25 MG PO TABS: 1.0000 | ORAL_TABLET | Freq: Every day | ORAL | 0 refills | Status: DC

## 2020-05-20 NOTE — Telephone Encounter (Signed)
Yes that is fine  Thanks   

## 2020-05-20 NOTE — Telephone Encounter (Signed)
Ok to refill patient's Biktarvy, Cymbalta, and ondansetron? I scheduled her for follow-up 3/29, thanks!

## 2020-06-01 ENCOUNTER — Encounter: Payer: Self-pay | Admitting: Family

## 2020-06-01 ENCOUNTER — Telehealth: Payer: Self-pay

## 2020-06-01 ENCOUNTER — Ambulatory Visit (INDEPENDENT_AMBULATORY_CARE_PROVIDER_SITE_OTHER): Payer: Self-pay | Admitting: Family

## 2020-06-01 ENCOUNTER — Other Ambulatory Visit: Payer: Self-pay

## 2020-06-01 VITALS — BP 115/80 | HR 78 | Wt 122.0 lb

## 2020-06-01 DIAGNOSIS — B20 Human immunodeficiency virus [HIV] disease: Secondary | ICD-10-CM

## 2020-06-01 DIAGNOSIS — B182 Chronic viral hepatitis C: Secondary | ICD-10-CM

## 2020-06-01 DIAGNOSIS — Z113 Encounter for screening for infections with a predominantly sexual mode of transmission: Secondary | ICD-10-CM

## 2020-06-01 DIAGNOSIS — R768 Other specified abnormal immunological findings in serum: Secondary | ICD-10-CM

## 2020-06-01 DIAGNOSIS — F331 Major depressive disorder, recurrent, moderate: Secondary | ICD-10-CM

## 2020-06-01 DIAGNOSIS — Z79899 Other long term (current) drug therapy: Secondary | ICD-10-CM

## 2020-06-01 MED ORDER — DULOXETINE HCL 30 MG PO CPEP: 30.0000 mg | ORAL_CAPSULE | Freq: Two times a day (BID) | ORAL | 2 refills | Status: DC

## 2020-06-01 MED ORDER — BIKTARVY 50-200-25 MG PO TABS
1.0000 | ORAL_TABLET | Freq: Every day | ORAL | 2 refills | Status: DC
Start: 2020-06-01 — End: 2020-09-10

## 2020-06-01 MED ORDER — HYDROXYZINE HCL 50 MG PO TABS: 50.0000 mg | ORAL_TABLET | Freq: Three times a day (TID) | ORAL | 0 refills | Status: DC | PRN

## 2020-06-01 NOTE — Telephone Encounter (Signed)
Attempted to call patient to see if able to make appointment today. Unable to leave voicemail as mailbox is not set up. No mychart access.   Taleia Sadowski Loyola Mast, RN

## 2020-06-01 NOTE — Patient Instructions (Signed)
Nice to see you.  We will check your lab work today.  Continue to take your [place] as prescribed daily.  Increased your Cymbalta to 30 mg twice daily and start hydroxyzine as needed.   We will set you up with an appointment with our counselor.   Plan for follow up in 1 month or sooner if needed.   Have a great day and stay safe!

## 2020-06-01 NOTE — Progress Notes (Signed)
Subjective:    Patient ID: Kelilah Hebard, female    DOB: Oct 10, 1990, 30 y.o.   MRN: [id number]  Chief Complaint  Patient presents with  . Follow-up    Declined condoms; reports increased depression/sadness; confirmed patient phone number; reports gettng calls, just not answering.      HPI:  Sonakshi Rolland is a 30 y.o. female with HIV disease last seen on 08/19/2019 with adequately controlled virus and good adherence and tolerance to her ART regimen of Biktarvy.  Viral load at the time was 53 with CD4 count of 541.  Additional lab work completed on 02/26/2020 with viral load that was undetectable and CD4 count of 577.  Here today for routine follow-up.  Ms. Thurlow continues to take her Biktarvy as prescribed daily with no adverse side effects and occasional missed doses.  Has concerns because she has been having some bowel issues started prior to starting [place] which are primarily constipation and occasional abdominal pain.  Has been also having mental "blackouts".  This occurs when she becomes stressed or anxious.  No suicidal ideations.  Has noticed the symptoms occurring more frequently recently.  Continues to take Cymbalta and notes her symptoms are stable but feels like her dose can be increased. Denies fevers, chills, night sweats, headaches, changes in vision, neck pain/stiffness, nausea, diarrhea, vomiting, lesions or rashes.  Ms. Swartzentruber has no problems obtaining medication from the pharmacy.  Would like to see counseling to help with her anxiety/depression.  Continues to use meth and heroin as well as smokes tobacco approximately 1 pack/day.  No current alcohol consumption.  Declines condoms.  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 Due for routine dental care.    Allergies  Allergen Reactions  . Bactrim [Sulfamethoxazole-Trimethoprim] Other (See Comments)    Per pt: makes her "blood pressure plummet"   .  Codeine Other (See Comments)    Per pt: makes her feel like she's going to have a heart attack      Outpatient Medications Prior to Visit  Medication Sig Dispense Refill  . bictegravir-emtricitabine-tenofovir AF (BIKTARVY) 50-200-25 MG TABS tablet Take 1 tablet by mouth daily. 30 tablet 0  . DULoxetine (CYMBALTA) 30 MG capsule TAKE 1 CAPSULE(30 MG) BY MOUTH DAILY 30 capsule 0  . ondansetron (ZOFRAN-ODT) 4 MG disintegrating tablet DISSOLVE 1 TABLET(4 MG) ON THE TONGUE EVERY 8 HOURS AS NEEDED FOR NAUSEA OR VOMITING 30 tablet 0  . Glecaprevir-Pibrentasvir (MAVYRET) 100-40 MG TABS Take 3 tablets by mouth daily after supper. (Patient not taking: Reported on 06/01/2020) 84 tablet 1  . omeprazole (PRILOSEC) 20 MG capsule TAKE 1 CAPSULE(20 MG) BY MOUTH DAILY (Patient not taking: Reported on 06/01/2020) 30 capsule 0   No facility-administered medications prior to visit.     History reviewed. No pertinent past medical history.   History reviewed. No pertinent surgical history.     Review of Systems  Constitutional: Negative for appetite change, chills, diaphoresis, fatigue, fever and unexpected weight change.  Eyes:       Negative for acute change in vision  Respiratory: Negative for chest tightness, shortness of breath and wheezing.   Cardiovascular: Negative for chest pain.  Gastrointestinal: Negative for diarrhea, nausea and vomiting.  Genitourinary: Negative for dysuria, pelvic pain and vaginal discharge.  Musculoskeletal: Negative for neck pain and neck stiffness.  Skin: Negative for rash.  Neurological: Negative for seizures, syncope, weakness and headaches.  Hematological: Negative for adenopathy. Does not bruise/bleed easily.  Psychiatric/Behavioral: Negative for hallucinations.      Objective:    BP 115/80   Pulse 78   Wt 122 lb (55.3 kg)   BMI 22.31 kg/m  Nursing note and vital signs reviewed.  Physical Exam Constitutional:      General: She is not in acute  distress.    Appearance: She is well-developed.     Comments: Appears older than stated age.   HENT:     Mouth/Throat:     Comments: Poor dentition.  Eyes:     Conjunctiva/sclera: Conjunctivae normal.  Cardiovascular:     Rate and Rhythm: Normal rate and regular rhythm.     Heart sounds: Normal heart sounds. No murmur heard. No friction rub. No gallop.   Pulmonary:     Effort: Pulmonary effort is normal. No respiratory distress.     Breath sounds: Normal breath sounds. No wheezing or rales.  Chest:     Chest wall: No tenderness.  Abdominal:     General: Bowel sounds are normal.     Palpations: Abdomen is soft.     Tenderness: There is no abdominal tenderness.  Musculoskeletal:     Cervical back: Neck supple.  Lymphadenopathy:     Cervical: No cervical adenopathy.  Skin:    General: Skin is warm and dry.     Findings: No rash.  Neurological:     Mental Status: She is alert and oriented to person, place, and time.      Depression screen PHQ 2/9 06/01/2020  Decreased Interest 0  Down, Depressed, Hopeless 0  PHQ - 2 Score 0       Assessment & Plan:    Patient Active Problem List   Diagnosis Date Noted  . Chronic hepatitis C without hepatic coma ([place]) 07/24/2019  . Hepatitis B surface antigen positive 07/24/2019  . Depression 07/24/2019  . HIV (human immunodeficiency virus infection) ([place]) 06/18/2019  . Drug abuse ([place]) 06/18/2019  . Toxic metabolic encephalopathy 67/67/2094  . SIRS (systemic inflammatory response syndrome) ([place]) 06/17/2019  . Hypokalemia 06/17/2019  . Overdose 06/17/2019     Problem List Items Addressed This Visit      Digestive   Chronic hepatitis C without hepatic coma (HCC)    Previously treated for hepatitis C with 8 weeks of [place]. Viral load on 02/25/21 was undetectable indicating SVR12 and cure of Hepatitis C. Will recheck Hepatitis C today for confirmation.       Relevant Medications   bictegravir-emtricitabine-tenofovir AF  (BIKTARVY) 50-200-25 MG TABS tablet     Other   HIV (human immunodeficiency virus infection) ([place]) - Primary    Mr. Mohrmann appears to have adequately controlled HIV with good adherence and tolerance to her ART regimen of Biktarvy.  No signs/symptoms of opportunistic infection or progressive HIV.  We reviewed lab work and discussed plan of care.  Met with financial counselors to renew financial assistance.  Continue current dose of Biktarvy.  Plan for follow-up in 1 month or sooner if needed.      Relevant Medications   bictegravir-emtricitabine-tenofovir AF (BIKTARVY) 50-200-25 MG TABS tablet   Hepatitis B surface antigen positive    Most recent Hepatitis B Surface Antigen negative and Surface Antibody borderline. Appears to be resolving independently. Will recheck surface antibody today to determine need for vaccination.       Depression    Ms. Mansour continues to have waxing and waning depression and anxiety likely exacerbated by continued drug use. No suicidal ideation or signs of psychosis.  Increase Cymbalta and start hydroxyzine as needed. Arrange appointment with counseling. Advised to seek further care if symptoms continue to worsen or do not improve or if thoughts of suicide develop.       Relevant Medications   DULoxetine (CYMBALTA) 30 MG capsule   hydrOXYzine (ATARAX/VISTARIL) 50 MG tablet    Other Visit Diagnoses    Screening for STDs (sexually transmitted diseases)       Pharmacologic therapy           I have discontinued [place] and omeprazole. I have also changed her DULoxetine. Additionally, I am having her start on hydrOXYzine. Lastly, I am having her maintain her ondansetron and Biktarvy.   Meds ordered this encounter  Medications  . DULoxetine (CYMBALTA) 30 MG capsule    Sig: Take 1 capsule (30 mg total) by mouth 2 (two) times daily.    Dispense:  60 capsule    Refill:  2    Order Specific Question:   Supervising Provider    Answer:    Carlyle Basques [[id number]]  . bictegravir-emtricitabine-tenofovir AF (BIKTARVY) 50-200-25 MG TABS tablet    Sig: Take 1 tablet by mouth daily.    Dispense:  30 tablet    Refill:  2    Order Specific Question:   Supervising Provider    Answer:   Carlyle Basques [[id number]]  . hydrOXYzine (ATARAX/VISTARIL) 50 MG tablet    Sig: Take 1 tablet (50 mg total) by mouth 3 (three) times daily as needed for anxiety.    Dispense:  90 tablet    Refill:  0    Order Specific Question:   Supervising Provider    Answer:   Carlyle Basques [[id number]]     Follow-up: Return in about 1 month (around 07/02/2020), or if symptoms worsen or fail to improve.   Terri Piedra, MSN, FNP-C Nurse Practitioner [facility] [place] number: [phone]

## 2020-06-02 NOTE — Assessment & Plan Note (Signed)
Cindy Robertson appears to have adequately controlled HIV with good adherence and tolerance to her ART regimen of Biktarvy.  No signs/symptoms of opportunistic infection or progressive HIV.  We reviewed lab work and discussed plan of care.  Met with financial counselors to renew financial assistance.  Continue current dose of Biktarvy.  Plan for follow-up in 1 month or sooner if needed.

## 2020-06-02 NOTE — Assessment & Plan Note (Signed)
Cindy Robertson continues to have waxing and waning depression and anxiety likely exacerbated by continued drug use. No suicidal ideation or signs of psychosis. Increase Cymbalta and start hydroxyzine as needed. Arrange appointment with counseling. Advised to seek further care if symptoms continue to worsen or do not improve or if thoughts of suicide develop.

## 2020-06-02 NOTE — Assessment & Plan Note (Signed)
Most recent Hepatitis B Surface Antigen negative and Surface Antibody borderline. Appears to be resolving independently. Will recheck surface antibody today to determine need for vaccination.

## 2020-06-02 NOTE — Assessment & Plan Note (Signed)
Previously treated for hepatitis C with 8 weeks of [place]. Viral load on 02/25/21 was undetectable indicating SVR12 and cure of Hepatitis C. Will recheck Hepatitis C today for confirmation.

## 2020-06-10 ENCOUNTER — Ambulatory Visit: Payer: Self-pay

## 2020-06-25 ENCOUNTER — Encounter: Payer: Self-pay | Admitting: Family

## 2020-07-02 ENCOUNTER — Other Ambulatory Visit: Payer: Self-pay

## 2020-07-02 ENCOUNTER — Ambulatory Visit (INDEPENDENT_AMBULATORY_CARE_PROVIDER_SITE_OTHER): Payer: Self-pay | Admitting: Family

## 2020-07-02 ENCOUNTER — Ambulatory Visit
Admission: RE | Admit: 2020-07-02 | Discharge: 2020-07-02 | Disposition: A | Discharge status: Home / Self Care | Payer: Medicaid Other | Source: Ambulatory Visit | Attending: Family | Admitting: Family

## 2020-07-02 ENCOUNTER — Encounter: Payer: Self-pay | Admitting: Family

## 2020-07-02 VITALS — Temp 97.9°F | Wt 131.0 lb

## 2020-07-02 DIAGNOSIS — Z21 Asymptomatic human immunodeficiency virus [HIV] infection status: Secondary | ICD-10-CM

## 2020-07-02 DIAGNOSIS — M545 Low back pain, unspecified: Secondary | ICD-10-CM

## 2020-07-02 DIAGNOSIS — M542 Cervicalgia: Secondary | ICD-10-CM | POA: Insufficient documentation

## 2020-07-02 DIAGNOSIS — B20 Human immunodeficiency virus [HIV] disease: Secondary | ICD-10-CM

## 2020-07-02 DIAGNOSIS — K2100 Gastro-esophageal reflux disease with esophagitis, without bleeding: Secondary | ICD-10-CM

## 2020-07-02 MED ORDER — PANTOPRAZOLE SODIUM 40 MG PO TBEC
40.0000 mg | DELAYED_RELEASE_TABLET | Freq: Every day | ORAL | 2 refills | Status: DC
Start: 2020-07-02 — End: 2020-07-14

## 2020-07-02 NOTE — Assessment & Plan Note (Addendum)
Cindy Robertson appears to have acute bilateral low back pain without sciatica with no significant radiculopathy or findings on exam.  Check x-ray.  No saddle anesthesia or changes to bowel/bladder with little concern for cauda equina syndrome.  May require physical therapy or additional imaging if symptoms worsen or do not improve.

## 2020-07-02 NOTE — Patient Instructions (Addendum)
Nice to see you.  Continue to take your medication daily.  Refills will be sent to the pharmacy.  Referral placed to gastroenterology for your stomach acid.  Plan for follow up in 3 months or sooner if needed.   We will get your x-rays today.   Will get you established with primary care for routine care. [facility] [phone] [address] [place], [place] [address]  Have a great day and stay safe

## 2020-07-02 NOTE — Progress Notes (Signed)
Patient ID: Cindy Robertson, female    DOB: 09-04-1990, 30 y.o.   MRN: [id number]    Subjective:    Chief Complaint  Patient presents with  . Follow-up    B20     HPI:  Cindy Robertson is a 30 y.o. female with HIV disease last seen on 06/01/2020 with adequately controlled HIV with good adherence and tolerance to her ART regimen of Biktarvy.  No blood work was completed at the time.  Most recent blood work on 12/23 with a viral load that was undetectable and CD4 count of 577.  Here today for routine follow-up.  Her mother is present with her permission.  Cindy Robertson has been taking her medication as prescribed.  Several complaints today including acute onset back and neck pain after falling from an 8 foot ladder approximately a few months ago.  She was not seen by provider following and now having back and neck pain.  There is radiculopathy in her bilateral upper extremities described as shooting at times and worse at night.  There is some dull aching in her lower extremities but most of the pain is generalized across her lower back.  Occasional swelling is noted.  Has also been having issues with gastroesophageal reflux having previously been prescribed omeprazole and taking multiple pills in a day with little to no success/relief.  Cindy Robertson has no problems obtaining her HIV medications from the pharmacy.  Now covered through Medicaid.  Denies feelings of being down, depressed, or hopeless.  Declines condoms.  Continues to use meth and heroin and smokes tobacco approximately 1 pack/day.  Allergies  Allergen Reactions  . Sumatriptan-Naproxen Sodium Other (See Comments) and Shortness Of Breath    Causes tightness in chest Patient reports that she has taken ibuprofen before with no problems Increased headache   . Tramadol Hives, Itching, Rash and Shortness Of Breath  . Bactrim [Sulfamethoxazole-Trimethoprim] Other (See Comments)    Per pt: makes her "blood pressure plummet"    . Codeine Other (See Comments)    Per pt: makes her feel like she's going to have a heart attack  . Other   . Benzalkonium Chloride Rash      Outpatient Medications Prior to Visit  Medication Sig Dispense Refill  . bictegravir-emtricitabine-tenofovir AF (BIKTARVY) 50-200-25 MG TABS tablet Take 1 tablet by mouth daily. 30 tablet 2  . DULoxetine (CYMBALTA) 30 MG capsule Take 1 capsule (30 mg total) by mouth 2 (two) times daily. 60 capsule 2  . ondansetron (ZOFRAN-ODT) 4 MG disintegrating tablet DISSOLVE 1 TABLET(4 MG) ON THE TONGUE EVERY 8 HOURS AS NEEDED FOR NAUSEA OR VOMITING 30 tablet 0  . hydrOXYzine (ATARAX/VISTARIL) 50 MG tablet Take 1 tablet (50 mg total) by mouth 3 (three) times daily as needed for anxiety. (Patient not taking: Reported on 07/02/2020) 90 tablet 0   No facility-administered medications prior to visit.     No past medical history on file.   No past surgical history on file.     Review of Systems  Constitutional: Negative for appetite change, chills, diaphoresis, fatigue, fever and unexpected weight change.  Eyes:       Negative for acute change in vision  Respiratory: Negative for chest tightness, shortness of breath and wheezing.   Cardiovascular: Negative for chest pain.  Gastrointestinal: Positive for nausea. Negative for diarrhea and vomiting.       Positive for reflux.   Genitourinary: Negative for dysuria, pelvic pain and vaginal discharge.  Musculoskeletal: Positive for  back pain and neck pain. Negative for neck stiffness.  Skin: Negative for rash.  Neurological: Negative for seizures, syncope, weakness and headaches.  Hematological: Negative for adenopathy. Does not bruise/bleed easily.  Psychiatric/Behavioral: Negative for hallucinations.      Objective:    Temp 97.9 F (36.6 C) (Oral)   Wt 131 lb (59.4 kg)   BMI 23.96 kg/m  Nursing note and vital signs reviewed.  Physical Exam Constitutional:      General: She is not in acute  distress.    Appearance: She is well-developed.     Comments: Seated in the chair; pleasant.   Eyes:     Conjunctiva/sclera: Conjunctivae normal.  Neck:     Comments: Cervical tenderness around C7 with normal range of motion. Upper extremity muscle strength is 4+/5 throughout with normal sensation.  Cardiovascular:     Rate and Rhythm: Normal rate and regular rhythm.     Heart sounds: Normal heart sounds. No murmur heard. No friction rub. No gallop.   Pulmonary:     Effort: Pulmonary effort is normal. No respiratory distress.     Breath sounds: Normal breath sounds. No wheezing or rales.  Chest:     Chest wall: No tenderness.  Abdominal:     General: Bowel sounds are normal.     Palpations: Abdomen is soft.     Tenderness: There is no abdominal tenderness.  Musculoskeletal:     Cervical back: Neck supple.     Comments: Low back with no obvious deformity, discoloration, or edema.  No significant tenderness able to be elicited on palpation.  Lower extremity muscle strength within normal ranges.  Lymphadenopathy:     Cervical: No cervical adenopathy.  Skin:    General: Skin is warm and dry.     Findings: No rash.  Neurological:     Mental Status: She is alert and oriented to person, place, and time.  Psychiatric:        Behavior: Behavior normal.        Thought Content: Thought content normal.        Judgment: Judgment normal.      Depression screen PHQ 2/9 06/01/2020  Decreased Interest 0  Down, Depressed, Hopeless 0  PHQ - 2 Score 0       Assessment & Plan:    Patient Active Problem List   Diagnosis Date Noted  . Gastroesophageal reflux disease with esophagitis without hemorrhage 07/02/2020  . Neck pain 07/02/2020  . Acute bilateral low back pain without sciatica 07/02/2020  . Chronic hepatitis C without hepatic coma ([place]) 07/24/2019  . Hepatitis B surface antigen positive 07/24/2019  . Depression 07/24/2019  . HIV (human immunodeficiency virus infection) ([place])  06/18/2019  . Drug abuse ([place]) 06/18/2019  . Toxic metabolic encephalopathy 10/93/2355  . SIRS (systemic inflammatory response syndrome) ([place]) 06/17/2019  . Hypokalemia 06/17/2019  . Overdose 06/17/2019     Problem List Items Addressed This Visit      Digestive   Gastroesophageal reflux disease with esophagitis without hemorrhage    Cindy Robertson has symptoms consistent with gastroesophageal reflux refractory to baseline proton pump inhibitor therapy.  Discussed importance of taking medications as prescribed.  Prescription for pantoprazole sent to the pharmacy.  Recommend referral to gastroenterology for further evaluation and possible EGD given refractory symptoms.      Relevant Medications   pantoprazole (PROTONIX) 40 MG tablet   Other Relevant Orders   Ambulatory referral to Gastroenterology     Other   HIV (human  immunodeficiency virus infection) ([place]) - Primary    Cindy Robertson appears to have adequately controlled HIV disease with good adherence and tolerance to her ART regimen of Biktarvy.  Reviewed previous lab work and discussed plan of care.  No signs/symptoms of opportunistic infection or progressive HIV.  Check blood work today.  Continue current dose of Biktarvy.  Plan for follow-up in 3 months or sooner if needed with lab work on the same day.      Relevant Orders   HIV 1 RNA quant-no reflex-bld   Comp Met (CMET)   T-helper cells (CD4) count (not at [facility])   Neck pain    Cindy Robertson appears to have cervical neck pain resulting in radiculopathy to her bilateral upper extremities although cannot fully rule out carpal tunnel syndrome but does not appear to have symptoms consistent with repetitive motions.  Check cervical x-ray given recent fall.  Recommend conservative therapy with ice/heat, home exercise therapy and follow-up with primary care if symptoms worsen or do not improve.      Relevant Orders   DG Cervical Spine 1 View   Acute bilateral low back pain without  sciatica    Cindy Robertson appears to have acute bilateral low back pain without sciatica with no significant radiculopathy or findings on exam.  Check x-ray.  No saddle anesthesia or changes to bowel/bladder with little concern for cauda equina syndrome.  May require physical therapy or additional imaging if symptoms worsen or do not improve.      Relevant Orders   DG Lumbar Spine 2-3 Views       I am having Cindy Robertson start on pantoprazole. I am also having her maintain her ondansetron, DULoxetine, Biktarvy, and hydrOXYzine.   Meds ordered this encounter  Medications  . pantoprazole (PROTONIX) 40 MG tablet    Sig: Take 1 tablet (40 mg total) by mouth daily.    Dispense:  30 tablet    Refill:  2    Order Specific Question:   Supervising Provider    Answer:   Carlyle Basques [[id number]]     Follow-up: Return in about 3 months (around 10/01/2020), or if symptoms worsen or fail to improve.   Terri Piedra, MSN, FNP-C Nurse Practitioner [facility] [place] number: [phone]

## 2020-07-02 NOTE — Assessment & Plan Note (Signed)
Cindy Robertson appears to have cervical neck pain resulting in radiculopathy to her bilateral upper extremities although cannot fully rule out carpal tunnel syndrome but does not appear to have symptoms consistent with repetitive motions.  Check cervical x-ray given recent fall.  Recommend conservative therapy with ice/heat, home exercise therapy and follow-up with primary care if symptoms worsen or do not improve.

## 2020-07-02 NOTE — Assessment & Plan Note (Signed)
Cindy Robertson appears to have adequately controlled HIV disease with good adherence and tolerance to her ART regimen of Biktarvy.  Reviewed previous lab work and discussed plan of care.  No signs/symptoms of opportunistic infection or progressive HIV.  Check blood work today.  Continue current dose of Biktarvy.  Plan for follow-up in 3 months or sooner if needed with lab work on the same day.

## 2020-07-02 NOTE — Assessment & Plan Note (Signed)
Ms. Kulesza has symptoms consistent with gastroesophageal reflux refractory to baseline proton pump inhibitor therapy.  Discussed importance of taking medications as prescribed.  Prescription for pantoprazole sent to the pharmacy.  Recommend referral to gastroenterology for further evaluation and possible EGD given refractory symptoms.

## 2020-07-05 LAB — COMPREHENSIVE METABOLIC PANEL
AG Ratio: 1.4 (calc) (ref 1.0–2.5)
ALT: 9 U/L (ref 6–29)
AST: 10 U/L (ref 10–30)
Albumin: 4.3 g/dL (ref 3.6–5.1)
Alkaline phosphatase (APISO): 57 U/L (ref 31–125)
BUN: 11 mg/dL (ref 7–25)
CO2: 27 mmol/L (ref 20–32)
Calcium: 9.7 mg/dL (ref 8.6–10.2)
Chloride: 102 mmol/L (ref 98–110)
Creat: 0.75 mg/dL (ref 0.50–1.10)
Globulin: 3.1 g/dL (calc) (ref 1.9–3.7)
Glucose, Bld: 79 mg/dL (ref 65–99)
Potassium: 4.4 mmol/L (ref 3.5–5.3)
Sodium: 137 mmol/L (ref 135–146)
Total Bilirubin: 0.4 mg/dL (ref 0.2–1.2)
Total Protein: 7.4 g/dL (ref 6.1–8.1)

## 2020-07-05 LAB — HIV-1 RNA QUANT-NO REFLEX-BLD
HIV 1 RNA Quant: NOT DETECTED Copies/mL
HIV-1 RNA Quant, Log: NOT DETECTED Log cps/mL

## 2020-07-05 LAB — T-HELPER CELLS (CD4) COUNT (NOT AT ARMC)
Absolute CD4: 1008 cells/uL (ref 490–1740)
CD4 T Helper %: 37 % (ref 30–61)
Total lymphocyte count: 2697 cells/uL (ref 850–3900)

## 2020-07-08 ENCOUNTER — Ambulatory Visit: Payer: Medicaid Other

## 2020-07-13 ENCOUNTER — Telehealth: Payer: Self-pay

## 2020-07-13 ENCOUNTER — Ambulatory Visit: Payer: Medicaid Other

## 2020-07-13 NOTE — Telephone Encounter (Signed)
Matt, Pharmacist with [employer] in [place] called office regarding prescription for Pantoprazole. Rx is not covered under [employer].  Omeprazole is covered alternative option. Will forward message to provider. Juanita Laster, RMA

## 2020-07-14 ENCOUNTER — Other Ambulatory Visit: Payer: Self-pay

## 2020-07-14 DIAGNOSIS — K2100 Gastro-esophageal reflux disease with esophagitis, without bleeding: Secondary | ICD-10-CM

## 2020-07-14 MED ORDER — OMEPRAZOLE 40 MG PO CPDR: 40.0000 mg | DELAYED_RELEASE_CAPSULE | Freq: Every day | ORAL | 2 refills | Status: DC

## 2020-07-14 NOTE — Telephone Encounter (Signed)
Ok to switch to 40 mg of omeprazole PO daily.

## 2020-08-10 ENCOUNTER — Ambulatory Visit: Payer: Medicaid Other

## 2020-08-18 ENCOUNTER — Other Ambulatory Visit: Payer: Self-pay | Admitting: Family

## 2020-09-10 ENCOUNTER — Other Ambulatory Visit: Payer: Self-pay | Admitting: Family

## 2020-09-10 DIAGNOSIS — B20 Human immunodeficiency virus [HIV] disease: Secondary | ICD-10-CM

## 2020-09-23 ENCOUNTER — Ambulatory Visit: Payer: Medicaid Other | Admitting: Family

## 2020-09-28 ENCOUNTER — Encounter (HOSPITAL_COMMUNITY): Payer: Self-pay | Admitting: Student

## 2020-09-28 ENCOUNTER — Emergency Department (HOSPITAL_COMMUNITY)
Admission: EM | Admit: 2020-09-28 | Discharge: 2020-09-29 | Disposition: A | Discharge status: Home / Self Care | Payer: Self-pay | Attending: Emergency Medicine | Admitting: Emergency Medicine

## 2020-09-28 ENCOUNTER — Other Ambulatory Visit: Payer: Self-pay

## 2020-09-28 ENCOUNTER — Emergency Department (HOSPITAL_COMMUNITY): Payer: Self-pay

## 2020-09-28 DIAGNOSIS — X58XXXA Exposure to other specified factors, initial encounter: Secondary | ICD-10-CM | POA: Insufficient documentation

## 2020-09-28 DIAGNOSIS — F17210 Nicotine dependence, cigarettes, uncomplicated: Secondary | ICD-10-CM | POA: Insufficient documentation

## 2020-09-28 DIAGNOSIS — Z21 Asymptomatic human immunodeficiency virus [HIV] infection status: Secondary | ICD-10-CM | POA: Insufficient documentation

## 2020-09-28 DIAGNOSIS — S0081XA Abrasion of other part of head, initial encounter: Secondary | ICD-10-CM | POA: Insufficient documentation

## 2020-09-28 DIAGNOSIS — F1910 Other psychoactive substance abuse, uncomplicated: Secondary | ICD-10-CM | POA: Insufficient documentation

## 2020-09-28 DIAGNOSIS — Z79899 Other long term (current) drug therapy: Secondary | ICD-10-CM | POA: Insufficient documentation

## 2020-09-28 DIAGNOSIS — Y900 Blood alcohol level of less than 20 mg/100 ml: Secondary | ICD-10-CM | POA: Insufficient documentation

## 2020-09-28 HISTORY — DX: Human immunodeficiency virus (HIV) disease: B20

## 2020-09-28 HISTORY — DX: Unspecified viral hepatitis B without hepatic coma: B19.10

## 2020-09-28 HISTORY — DX: Depression, unspecified: F32.A

## 2020-09-28 HISTORY — DX: Unspecified viral hepatitis C without hepatic coma: B19.20

## 2020-09-28 HISTORY — DX: Asymptomatic human immunodeficiency virus (hiv) infection status: Z21

## 2020-09-28 LAB — URINALYSIS, ROUTINE W REFLEX MICROSCOPIC
Bilirubin Urine: NEGATIVE
Glucose, UA: NEGATIVE mg/dL
Ketones, ur: NEGATIVE mg/dL
Leukocytes,Ua: NEGATIVE
Nitrite: NEGATIVE
Protein, ur: 100 mg/dL — AB
Specific Gravity, Urine: 1.025 (ref 1.005–1.030)
pH: 5 (ref 5.0–8.0)

## 2020-09-28 LAB — COMPREHENSIVE METABOLIC PANEL
ALT: 37 U/L (ref 0–44)
AST: 75 U/L — ABNORMAL HIGH (ref 15–41)
Albumin: 4.1 g/dL (ref 3.5–5.0)
Alkaline Phosphatase: 54 U/L (ref 38–126)
Anion gap: 9 (ref 5–15)
BUN: 15 mg/dL (ref 6–20)
CO2: 25 mmol/L (ref 22–32)
Calcium: 8.4 mg/dL — ABNORMAL LOW (ref 8.9–10.3)
Chloride: 102 mmol/L (ref 98–111)
Creatinine, Ser: 0.82 mg/dL (ref 0.44–1.00)
GFR, Estimated: >60 mL/min (ref >60)
Glucose, Bld: 83 mg/dL (ref 70–99)
Potassium: 2.8 mmol/L — ABNORMAL LOW (ref 3.5–5.1)
Sodium: 136 mmol/L (ref 135–145)
Total Bilirubin: 0.9 mg/dL (ref 0.3–1.2)
Total Protein: 7.2 g/dL (ref 6.5–8.1)

## 2020-09-28 LAB — CBC WITH DIFFERENTIAL/PLATELET
Abs Immature Granulocytes: 0.04 10*3/uL (ref 0.00–0.07)
Basophils Absolute: 0.1 10*3/uL (ref 0.0–0.1)
Basophils Relative: 1 %
Eosinophils Absolute: 0.3 10*3/uL (ref 0.0–0.5)
Eosinophils Relative: 3 %
HCT: 31.2 % — ABNORMAL LOW (ref 36.0–46.0)
Hemoglobin: 10.2 g/dL — ABNORMAL LOW (ref 12.0–15.0)
Immature Granulocytes: 0 %
Lymphocytes Relative: 11 %
Lymphs Abs: 1.1 10*3/uL (ref 0.7–4.0)
MCH: 27.1 pg (ref 26.0–34.0)
MCHC: 32.7 g/dL (ref 30.0–36.0)
MCV: 83 fL (ref 80.0–100.0)
Monocytes Absolute: 0.8 10*3/uL (ref 0.1–1.0)
Monocytes Relative: 8 %
Neutro Abs: 7.8 10*3/uL — ABNORMAL HIGH (ref 1.7–7.7)
Neutrophils Relative %: 77 %
Platelets: 349 10*3/uL (ref 150–400)
RBC: 3.76 MIL/uL — ABNORMAL LOW (ref 3.87–5.11)
RDW: 15 % (ref 11.5–15.5)
WBC: 9.9 10*3/uL (ref 4.0–10.5)
nRBC: 0 % (ref 0.0–0.2)

## 2020-09-28 LAB — RAPID URINE DRUG SCREEN, HOSP PERFORMED
Amphetamines: POSITIVE — AB
Barbiturates: NOT DETECTED
Benzodiazepines: POSITIVE — AB
Cocaine: POSITIVE — AB
Opiates: NOT DETECTED
Tetrahydrocannabinol: NOT DETECTED

## 2020-09-28 LAB — ETHANOL: Alcohol, Ethyl (B): <10 mg/dL (ref <10)

## 2020-09-28 LAB — I-STAT BETA HCG BLOOD, ED (MC, WL, AP ONLY): I-stat hCG, quantitative: <5 m[IU]/mL (ref <5)

## 2020-09-28 MED ORDER — POTASSIUM CHLORIDE 10 MEQ/100ML IV SOLN
10.0000 meq | INTRAVENOUS | Status: AC
  Administered 2020-09-28 (×2): 10 meq via INTRAVENOUS
  Filled 2020-09-28 (×2): qty 100

## 2020-09-28 MED ORDER — POTASSIUM CHLORIDE CRYS ER 20 MEQ PO TBCR
40.0000 meq | EXTENDED_RELEASE_TABLET | Freq: Once | ORAL | Status: AC
  Administered 2020-09-28: 40 meq via ORAL
  Filled 2020-09-28: qty 2

## 2020-09-28 NOTE — ED Provider Notes (Signed)
[facility]  HOSPITAL-EMERGENCY DEPT Provider Note   CSN: [id number] Arrival date & time: 09/28/20  6203     History Chief Complaint  Patient presents with   Drug Problem   Psychiatric Evaluation    Cindy Robertson is a 30 y.o. female.  Patient is a 30 year old female with a history of HIV, hepatitis C and GERD who presents after presumed psychosis related to ingestion.  History is obtained by EMS as patient is currently unresponsive.  EMS found the patient in one of the local motels surrounded by drug paraphernalia.  It was reported that she did take some Valium but it is unknown if she took other substances as well.  There was a white powdery substance found as well as needles in the hotel room.  She was very agitated and combative on scene.  She received 5 mg of Haldol and 5 mg of Versed IM per EMS.  She became sedated following this.  She was noted to be hyperventilating but is maintaining capnography and oxygen saturations.  She is on nasal cannula at 4 L/min.  Her blood glucose is 105 by EMS.      Past Medical History:  Diagnosis Date   Depression    Hepatitis B    Hepatitis C    HIV (human immunodeficiency virus infection) (HCC)     Patient Active Problem List   Diagnosis Date Noted   Gastroesophageal reflux disease with esophagitis without hemorrhage 07/02/2020   Neck pain 07/02/2020   Acute bilateral low back pain without sciatica 07/02/2020   Chronic hepatitis C without hepatic coma (HCC) 07/24/2019   Hepatitis B surface antigen positive 07/24/2019   Depression 07/24/2019   HIV (human immunodeficiency virus infection) (HCC) 06/18/2019   Drug abuse (HCC) 06/18/2019   Toxic metabolic encephalopathy 06/17/2019   SIRS (systemic inflammatory response syndrome) (HCC) 06/17/2019   Hypokalemia 06/17/2019   Overdose 06/17/2019    History reviewed. No pertinent surgical history.   OB History   No obstetric history on file.     Family History  Family  history unknown: Yes    Social History   Tobacco Use   Smoking status: Every Day    Packs/day: 1.00    Types: Cigarettes   Smokeless tobacco: Current  Substance Use Topics   Alcohol use: Yes   Drug use: Yes    Types: Heroin, Methamphetamines, Cocaine, IV    Home Medications Prior to Admission medications   Medication Sig Start Date End Date Taking? Authorizing Provider  BIKTARVY 50-200-25 MG TABS tablet TAKE 1 TABLET BY MOUTH DAILY 09/10/20   Veryl Speak, FNP  DULoxetine (CYMBALTA) 30 MG capsule TAKE 1 CAPSULE(30 MG) BY MOUTH TWICE DAILY 08/18/20   Veryl Speak, FNP  hydrOXYzine (ATARAX/VISTARIL) 50 MG tablet TAKE 1 TABLET(50 MG) BY MOUTH THREE TIMES DAILY AS NEEDED FOR ANXIETY 08/18/20   Veryl Speak, FNP  omeprazole (PRILOSEC) 40 MG capsule Take 1 capsule (40 mg total) by mouth daily. 07/14/20 08/13/20  Veryl Speak, FNP  ondansetron (ZOFRAN-ODT) 4 MG disintegrating tablet DISSOLVE 1 TABLET(4 MG) ON THE TONGUE EVERY 8 HOURS AS NEEDED FOR NAUSEA OR VOMITING 05/20/20   Veryl Speak, FNP    Allergies    Sumatriptan-naproxen sodium, Tramadol, Bactrim [sulfamethoxazole-trimethoprim], Codeine, Other, and Benzalkonium chloride  Review of Systems   Review of Systems  Unable to perform ROS: Mental status change   Physical Exam Updated Vital Signs BP 113/65   Pulse 70   Temp 98.3 F (36.8  C) (Rectal)   Resp 11   Ht 5\' 2"  (1.575 m)   Wt 60 kg   SpO2 98%   BMI 24.19 kg/m   Physical Exam Constitutional:      Appearance: She is ill-appearing.     Comments: Unresponsive  HENT:     Head:     Comments: Abrasion to forehead and chin.  No facial swelling.  No bleeding from the naris    Mouth/Throat:     Mouth: Mucous membranes are moist.  Eyes:     Comments: Pupils are small but symmetric  Neck:     Comments: No obvious deformity Cardiovascular:     Rate and Rhythm: Normal rate.  Pulmonary:     Effort: No respiratory distress.     Breath sounds: No  wheezing, rhonchi or rales.     Comments: Patient has some hypoventilation with respiratory rate between 6 and 7.  She is maintaining oxygen saturations on nasal cannula at 98%.  Her capnography is 42 with good waveform. Abdominal:     General: Abdomen is flat. There is no distension.     Palpations: Abdomen is soft.     Tenderness: There is no abdominal tenderness.  Musculoskeletal:        General: Normal range of motion.     Comments: Multiple scabs to her extremities, no obvious cellulitis  Neurological:     Comments: Unresponsive    ED Results / Procedures / Treatments   Labs (all labs ordered are listed, but only abnormal results are displayed) Labs Reviewed  COMPREHENSIVE METABOLIC PANEL - Abnormal; Notable for the following components:      Result Value   Potassium 2.8 (*)    Calcium 8.4 (*)    AST 75 (*)    All other components within normal limits  CBC WITH DIFFERENTIAL/PLATELET - Abnormal; Notable for the following components:   RBC 3.76 (*)    Hemoglobin 10.2 (*)    HCT 31.2 (*)    Neutro Abs 7.8 (*)    All other components within normal limits  RAPID URINE DRUG SCREEN, HOSP PERFORMED - Abnormal; Notable for the following components:   Cocaine POSITIVE (*)    Benzodiazepines POSITIVE (*)    Amphetamines POSITIVE (*)    All other components within normal limits  URINALYSIS, ROUTINE W REFLEX MICROSCOPIC - Abnormal; Notable for the following components:   Color, Urine AMBER (*)    APPearance HAZY (*)    Hgb urine dipstick MODERATE (*)    Protein, ur 100 (*)    Bacteria, UA FEW (*)    All other components within normal limits  ETHANOL  I-STAT BETA HCG BLOOD, ED ([facility], [facility], [facility] ONLY)    EKG EKG Interpretation  Date/Time:  Tuesday September 28 2020 09:34:58 EDT Ventricular Rate:  81 PR Interval:  139 QRS Duration: 96 QT Interval:  413 QTC Calculation: 480 R Axis:   61 Text Interpretation: Sinus rhythm Borderline repolarization abnormality Borderline prolonged QT  interval Confirmed by [id number] [phone]) on 09/28/2020 11:36:13 AM  Radiology CT Head Wo Contrast  Result Date: 09/28/2020 CLINICAL DATA:  30 year old female found down outside. Combative. Multiple abrasions. EXAM: CT HEAD WITHOUT CONTRAST TECHNIQUE: Contiguous axial images were obtained from the base of the skull through the vertex without intravenous contrast. COMPARISON:  Head CT 06/17/2019. FINDINGS: Brain: No midline shift, ventriculomegaly, mass effect, evidence of mass lesion, intracranial hemorrhage or evidence of cortically based acute infarction. Gray-white matter differentiation is within normal limits throughout  the brain. Vascular: No suspicious intracranial vascular hyperdensity. Skull: Negative. Sinuses/Orbits: Visualized paranasal sinuses and mastoids are stable and well aerated. Other: Orbit and scalp soft tissues appear stable and within normal limits. IMPRESSION: Stable and normal noncontrast Head CT. Electronically Signed   By: Odessa Fleming M.D.   On: 09/28/2020 10:25   CT Cervical Spine Wo Contrast  Result Date: 09/28/2020 CLINICAL DATA:  30 year old female found down outside. Combative. Multiple abrasions. EXAM: CT CERVICAL SPINE WITHOUT CONTRAST TECHNIQUE: Multidetector CT imaging of the cervical spine was performed without intravenous contrast. Multiplanar CT image reconstructions were also generated. COMPARISON:  Head CT today. FINDINGS: Alignment: Mild reversal of cervical lordosis. Cervicothoracic junction alignment is within normal limits. Bilateral posterior element alignment is within normal limits. Skull base and vertebrae: Visualized skull base is intact. No atlanto-occipital dissociation. C1 and C2 appear intact and aligned. No osseous abnormality identified. Soft tissues and spinal canal: No prevertebral fluid or swelling. No visible canal hematoma. Negative noncontrast visible neck soft tissues. Disc levels: Minor cervical disc bulging. Spinal canal appears capacious. Upper  chest: Mild motion artifact.  Mild apical lung scarring. IMPRESSION: No acute traumatic injury identified in the cervical spine. Electronically Signed   By: Odessa Fleming M.D.   On: 09/28/2020 10:28    Procedures Procedures   Medications Ordered in ED Medications  potassium chloride SA (KLOR-CON) CR tablet 40 mEq (has no administration in time range)  potassium chloride 10 mEq in 100 mL IVPB (0 mEq Intravenous Stopped 09/28/20 1402)    ED Course  I have reviewed the triage vital signs and the nursing notes.  Pertinent labs & imaging results that were available during my care of the patient were reviewed by me and considered in my medical decision making (see chart for details).    MDM Rules/Calculators/A&P                           Patient is a 30 year old female who presents after an ingestion with some psychotic behavior.  She was very sedate on arrival but maintaining her airway and oxygenation.  Her end-tidal CO2 was stable.  Her labs are nonconcerning.  She is positive for multiple substances in her urine.  She was monitored for several hours.  She is more alert now but she is not at the point where she can be discharged home.  Will need some further monitoring for metabolization.  Her head CT shows no acute abnormality.  Patient care turned over to Dr. Clarene Duke pending reevaluation. Final Clinical Impression(s) / ED Diagnoses Final diagnoses:  Polysubstance abuse [facility])    Rx / DC Orders ED Discharge Orders     None        Rolan Bucco, MD 09/28/20 [phone]

## 2020-09-28 NOTE — ED Triage Notes (Signed)
Pt has a hx of HIV

## 2020-09-28 NOTE — ED Provider Notes (Signed)
I received this patient in signout from Dr. Fredderick Phenix.  Briefly, she had presented with polysubstance ingestion and psychosis, requiring sedation for agitation.  At time of signout, patient had been medically screened and we were awaiting her to metabolize to clinical sobriety.  For several hours of observation, patient is resting comfortably, easily arousable by voice.  She is able to answer questions appropriately.  Discharged with outpatient substance abuse resources.   Loel Betancur, Ambrose Finland, MD 09/28/20 2056

## 2020-09-28 NOTE — ED Notes (Signed)
Patient's belongings bag with clothing and shoes placed in cabinet at nursing station.  Patient pocket knife placed in biohazard bag labeled and given to [employer] with Security.

## 2020-09-28 NOTE — ED Triage Notes (Signed)
Patient BIB [facility]. Pt was found outside [address] surrounded by needles and unknown drugs. Friend states she took some valium. Pt is a known drug abuser. 5mg  haldol and versed were given by EMS. Pt was combative upon arrival.  105-CBG 100% on 4L 100/45 86-hr 97.5

## 2020-09-28 NOTE — ED Notes (Signed)
Patient transported to CT 

## 2020-09-28 NOTE — ED Notes (Signed)
Patient is on her menses

## 2020-09-29 NOTE — ED Notes (Signed)
Pt given discharge papers and something to eat and directed to the lobby to continue to call for a ride

## 2020-11-02 ENCOUNTER — Other Ambulatory Visit: Payer: Self-pay | Admitting: Family

## 2020-11-02 DIAGNOSIS — K2100 Gastro-esophageal reflux disease with esophagitis, without bleeding: Secondary | ICD-10-CM

## 2020-11-11 ENCOUNTER — Ambulatory Visit: Payer: Medicaid Other | Admitting: Family

## 2020-12-09 ENCOUNTER — Telehealth: Payer: Self-pay

## 2020-12-09 NOTE — Telephone Encounter (Signed)
Received voicemail from [facility] stating they have been unable to contact the patient to arrange refills.   Tried calling the patient on both numbers listed in the chart, call was unable to go through. Patient is overdue for appointment and need to renew ADAP.   Sandie Ano, RN

## 2021-01-11 ENCOUNTER — Telehealth: Payer: Self-pay

## 2021-01-11 ENCOUNTER — Ambulatory Visit: Payer: Self-pay | Admitting: Family

## 2021-01-11 ENCOUNTER — Ambulatory Visit: Payer: Medicaid Other

## 2021-01-11 NOTE — Telephone Encounter (Signed)
   Cindy Robertson DOB: [birth year] MRN: [id number]   RIDER WAIVER AND RELEASE OF LIABILITY  For purposes of improving physical access to our facilities, [facility] is pleased to partner with third parties to provide Cindy Robertson patients or other authorized individuals the option of convenient, on-demand ground transportation services (the [employer]") through use of the technology service that enables users to request on-demand ground transportation from independent third-party providers.  By opting to use and accept these [employer], I, the undersigned, hereby agree on behalf of myself, and on behalf of any minor child using the Cindy Robertson for whom I am the parent or legal guardian, as follows:  Cindy Robertson provided to me are provided by independent third-party transportation providers who are not [employer] or employees and who are unaffiliated with [employer]. Cindy Robertson is neither a transportation carrier nor a common or public carrier. Cindy Robertson has no control over the quality or safety of the transportation that occurs as a result of the [employer]. [facility] cannot guarantee that any third-party transportation provider will complete any arranged transportation service. Cindy Robertson makes no representation, warranty, or guarantee regarding the reliability, timeliness, quality, safety, suitability, or availability of any of the Transport Services or that they will be error free. I fully understand that traveling by vehicle involves risks and dangers of serious bodily injury, including permanent disability, paralysis, and death. I agree, on behalf of myself and on behalf of any minor child using the Transport Services for whom I am the parent or legal guardian, that the entire risk arising out of my use of the [employer] remains solely with me, to the maximum extent permitted under applicable law. The [employer] are provided "as  is" and "as available." Cindy Robertson disclaims all representations and warranties, express, implied or statutory, not expressly set out in these terms, including the implied warranties of merchantability and fitness for a particular purpose. I hereby waive and release Cindy Robertson, its agents, employees, officers, directors, representatives, insurers, attorneys, assigns, successors, subsidiaries, and affiliates from any and all past, present, or future claims, demands, liabilities, actions, causes of action, or suits of any kind directly or indirectly arising from acceptance and use of the [employer]. I further waive and release Cindy Robertson and its affiliates from all present and future liability and responsibility for any injury or death to persons or damages to property caused by or related to the use of the [employer]. I have read this Waiver and Release of Liability, and I understand the terms used in it and their legal significance. This Waiver is freely and voluntarily given with the understanding that my right (as well as the right of any minor child for whom I am the parent or legal guardian using the [employer]) to legal recourse against  in connection with the [employer] is knowingly surrendered in return for use of these services.   I attest that I read the consent document to Cindy Robertson, gave Cindy Robertson the opportunity to ask questions and answered the questions asked (if any). I affirm that Cindy Robertson then provided consent for she's participation in this program.     Cindy Robertson

## 2021-01-13 ENCOUNTER — Encounter: Payer: Self-pay | Admitting: Family

## 2021-01-13 ENCOUNTER — Other Ambulatory Visit (HOSPITAL_COMMUNITY): Payer: Self-pay

## 2021-01-13 ENCOUNTER — Other Ambulatory Visit: Payer: Self-pay

## 2021-01-13 ENCOUNTER — Ambulatory Visit (INDEPENDENT_AMBULATORY_CARE_PROVIDER_SITE_OTHER): Payer: Self-pay | Admitting: Family

## 2021-01-13 ENCOUNTER — Ambulatory Visit: Payer: Self-pay

## 2021-01-13 VITALS — BP 130/78 | HR 92 | Temp 98.3°F | Resp 16 | Ht 62.0 in | Wt 128.8 lb

## 2021-01-13 DIAGNOSIS — Z21 Asymptomatic human immunodeficiency virus [HIV] infection status: Secondary | ICD-10-CM

## 2021-01-13 DIAGNOSIS — M25562 Pain in left knee: Secondary | ICD-10-CM

## 2021-01-13 DIAGNOSIS — Z0000 Encounter for general adult medical examination without abnormal findings: Secondary | ICD-10-CM

## 2021-01-13 DIAGNOSIS — Z113 Encounter for screening for infections with a predominantly sexual mode of transmission: Secondary | ICD-10-CM

## 2021-01-13 DIAGNOSIS — R110 Nausea: Secondary | ICD-10-CM

## 2021-01-13 DIAGNOSIS — G8929 Other chronic pain: Secondary | ICD-10-CM

## 2021-01-13 DIAGNOSIS — B20 Human immunodeficiency virus [HIV] disease: Secondary | ICD-10-CM

## 2021-01-13 DIAGNOSIS — S22000S Wedge compression fracture of unspecified thoracic vertebra, sequela: Secondary | ICD-10-CM

## 2021-01-13 DIAGNOSIS — F319 Bipolar disorder, unspecified: Secondary | ICD-10-CM

## 2021-01-13 DIAGNOSIS — K2100 Gastro-esophageal reflux disease with esophagitis, without bleeding: Secondary | ICD-10-CM

## 2021-01-13 MED ORDER — HYDROXYZINE HCL 50 MG PO TABS: ORAL_TABLET | ORAL | 3 refills | Status: DC

## 2021-01-13 MED ORDER — DULOXETINE HCL 30 MG PO CPEP: ORAL_CAPSULE | ORAL | 2 refills | Status: DC

## 2021-01-13 MED ORDER — BIKTARVY 50-200-25 MG PO TABS: 1.0000 | ORAL_TABLET | Freq: Every day | ORAL | 3 refills | Status: DC

## 2021-01-13 MED ORDER — ONDANSETRON 4 MG PO TBDP: ORAL_TABLET | ORAL | 2 refills | Status: DC

## 2021-01-13 MED ORDER — OMEPRAZOLE 40 MG PO CPDR: DELAYED_RELEASE_CAPSULE | ORAL | 3 refills | Status: DC

## 2021-01-13 NOTE — Assessment & Plan Note (Signed)
Cindy Robertson has a compression fracture at T12 of her spine that she describes swelling at times.  Will refer to orthopedics for further assessment and treatment as indicated.  In the interim continue conservative care with ice, heat, and over-the-counter medications as needed for symptom relief and supportive care.

## 2021-01-13 NOTE — Assessment & Plan Note (Signed)
Cindy Robertson has chronic left knee pain unclear origin although suspect possibly a meniscal tear based on history and description although cannot rule out underlying osteoarthritis.  Will refer to orthopedics for further evaluation and treatment as indicated.  Continue conservative care with ice, heat, and over-the-counter medications as needed for symptom relief and supportive care in the interim.

## 2021-01-13 NOTE — Progress Notes (Signed)
Brief Narrative   Patient ID: Cindy Robertson, female    DOB: 1990-04-28, 30 y.o.   MRN: [id number]  Cindy Robertson is a 30 year old Caucasian female with HIV disease diagnosed in May 2021.  Initial CD4 count of 306 and viral load of 165,000.  Risk factors for acquiring HIV includes sexual contact and substance use.  Enters care at  [facility] stage II.  Geno sure with no significant medication resistance patterns.  No history of opportunistic infection.  Sole medication regimen of Biktarvy.  Subjective:    Chief Complaint  Patient presents with   Follow-up    B20 - bump on chin x "few months". Pt also has a red lesion on bottom of chin.     HPI:  Cindy Robertson is a 30 y.o. female with HIV disease last seen on 07/02/2020 with well-controlled virus and good adherence and tolerance to her ART regimen of Biktarvy.  Viral load at the time was undetectable with CD4 count of 1008.  In the interim she has been in the emergency room and hospitalized for polysubstance use and overdose.  Here today for follow-up.  Cindy Robertson continues to take her Biktarvy as prescribed with no adverse side effects.  She has multiple complaints today including left knee pain that has been going on for approximately 6 months and occurs primarily with squatting and describes feeling a tight/tense feeling including pressure in her left knee when trying to extend she has a popping sensation at times.  No trauma or injury.  Has not sought treatment for this.  A secondary concern is a lesion located on her chin that has been going on for several weeks and believes to be an ingrown hair that she cannot get out.  Concern for possible infection. Denies fevers, chills, night sweats, headaches, changes in vision, neck pain/stiffness, nausea, diarrhea, vomiting, lesions or rashes.  Cindy Robertson financial assistance has expired and will need to renew today.  Has been having fluctuating/waxing and waning moods previously on Cymbalta  with hydroxyzine.  Asking for gabapentin in addition to her current psychiatric regimen which was previously added per a psychiatric provider for mood stabilization.  Denies suicidal ideations.  Continues to use polysubstances and smoke tobacco.  Condoms offered.  Previous x-rays reviewed with compression fracture of T12.    Allergies  Allergen Reactions   Sumatriptan-Naproxen Sodium Other (See Comments) and Shortness Of Breath    Causes tightness in chest Patient reports that she has taken ibuprofen before with no problems Increased headache    Tramadol Hives, Itching, Rash and Shortness Of Breath   Bactrim [Sulfamethoxazole-Trimethoprim] Other (See Comments)    Per pt: makes her "blood pressure plummet"    Codeine Other (See Comments)    Per pt: makes her feel like she's going to have a heart attack   Other    Benzalkonium Chloride Rash      Outpatient Medications Prior to Visit  Medication Sig Dispense Refill   BIKTARVY 50-200-25 MG TABS tablet TAKE 1 TABLET BY MOUTH DAILY 30 tablet 2   DULoxetine (CYMBALTA) 30 MG capsule TAKE 1 CAPSULE(30 MG) BY MOUTH TWICE DAILY 60 capsule 1   hydrOXYzine (ATARAX/VISTARIL) 50 MG tablet TAKE 1 TABLET(50 MG) BY MOUTH THREE TIMES DAILY AS NEEDED FOR ANXIETY 90 tablet 0   omeprazole (PRILOSEC) 40 MG capsule TAKE 1 CAPSULE(40 MG) BY MOUTH DAILY 30 capsule 1   ondansetron (ZOFRAN-ODT) 4 MG disintegrating tablet DISSOLVE 1 TABLET(4 MG) ON THE TONGUE EVERY 8 HOURS AS NEEDED  FOR NAUSEA OR VOMITING 30 tablet 0   No facility-administered medications prior to visit.     Past Medical History:  Diagnosis Date   Depression    Hepatitis B    Hepatitis C    HIV (human immunodeficiency virus infection) (HCC)      History reviewed. No pertinent surgical history.    Review of Systems  Constitutional:  Negative for appetite change, chills, diaphoresis, fatigue, fever and unexpected weight change.  Eyes:        Negative for acute change in vision   Respiratory:  Negative for chest tightness, shortness of breath and wheezing.   Cardiovascular:  Negative for chest pain.  Gastrointestinal:  Negative for diarrhea, nausea and vomiting.  Genitourinary:  Negative for dysuria, pelvic pain and vaginal discharge.  Musculoskeletal:  Negative for neck pain and neck stiffness.       Positive for low back pain and left knee pain  Skin:  Negative for rash.  Neurological:  Negative for seizures, syncope, weakness and headaches.  Hematological:  Negative for adenopathy. Does not bruise/bleed easily.  Psychiatric/Behavioral:  Negative for hallucinations.      Objective:    BP 130/78   Pulse 92   Temp 98.3 F (36.8 C) (Temporal)   Resp 16   Ht 5\' 2"  (1.575 m)   Wt 128 lb 12.8 oz (58.4 kg)   SpO2 98%   BMI 23.56 kg/m  Nursing note and vital signs reviewed.  Physical Exam Constitutional:      General: She is not in acute distress.    Appearance: She is well-developed.  HENT:     Mouth/Throat:     Comments: Mr. has an oblong lesion located on the right aspect under her mandible consistent with ulceration where she has been picking.  No obvious hairs or foreign objects present. Eyes:     Conjunctiva/sclera: Conjunctivae normal.  Cardiovascular:     Rate and Rhythm: Normal rate and regular rhythm.     Heart sounds: Normal heart sounds. No murmur heard.   No friction rub. No gallop.  Pulmonary:     Effort: Pulmonary effort is normal. No respiratory distress.     Breath sounds: Normal breath sounds. No wheezing or rales.  Chest:     Chest wall: No tenderness.  Abdominal:     General: Bowel sounds are normal.     Palpations: Abdomen is soft.     Tenderness: There is no abdominal tenderness.  Musculoskeletal:     Cervical back: Neck supple.  Lymphadenopathy:     Cervical: No cervical adenopathy.  Skin:    General: Skin is warm and dry.     Findings: No rash.  Neurological:     Mental Status: She is alert and oriented to  person, place, and time.  Psychiatric:        Behavior: Behavior normal.        Thought Content: Thought content normal.        Judgment: Judgment normal.     Depression screen [facility] 2/9 01/13/2021 06/01/2020  Decreased Interest 2 0  Down, Depressed, Hopeless 1 0  PHQ - 2 Score 3 0  Altered sleeping 2 -  Tired, decreased energy 3 -  Change in appetite 2 -  Feeling bad or failure about yourself  3 -  Trouble concentrating 3 -  Moving slowly or fidgety/restless 0 -  Suicidal thoughts 0 -  PHQ-9 Score 16 -       Assessment & Plan:  Patient Active Problem List   Diagnosis Date Noted   Compression fx, thoracic spine, sequela 01/13/2021   Bipolar 1 disorder (HCC) 01/13/2021   Chronic pain of left knee 01/13/2021   Healthcare maintenance 01/13/2021   Gastroesophageal reflux disease with esophagitis without hemorrhage 07/02/2020   Neck pain 07/02/2020   Acute bilateral low back pain without sciatica 07/02/2020   Chronic hepatitis C without hepatic coma (HCC) 07/24/2019   Hepatitis B surface antigen positive 07/24/2019   Depression 07/24/2019   HIV (human immunodeficiency virus infection) (HCC) 06/18/2019   Drug abuse (HCC) 06/18/2019   Toxic metabolic encephalopathy 06/17/2019   SIRS (systemic inflammatory response syndrome) (HCC) 06/17/2019   Hypokalemia 06/17/2019   Overdose 06/17/2019     Problem List Items Addressed This Visit       Digestive   Gastroesophageal reflux disease with esophagitis without hemorrhage   Relevant Medications   omeprazole (PRILOSEC) 40 MG capsule     Musculoskeletal and Integument   Compression fx, thoracic spine, sequela - Primary    Cindy Robertson has a compression fracture at T12 of her spine that she describes swelling at times.  Will refer to orthopedics for further assessment and treatment as indicated.  In the interim continue conservative care with ice, heat, and over-the-counter medications as needed for symptom relief and supportive  care.      Relevant Orders   Ambulatory referral to Orthopedics     Other   HIV (human immunodeficiency virus infection) (HCC)    Cindy Robertson has questionable adherence with good tolerance to her ART regimen of Biktarvy based on refill status.  No signs/symptoms of opportunistic infection.  We reviewed previous lab work and discussed plan of care.  Check blood work today.  Continue current dose of Biktarvy.  Unfortunately her financial assistance expired.  We will provide samples until renewed.  Plan for follow-up in 3 months or sooner if needed with lab work on the same day.      Relevant Medications   bictegravir-emtricitabine-tenofovir AF (BIKTARVY) 50-200-25 MG TABS tablet   Other Relevant Orders   COMPLETE METABOLIC PANEL WITH GFR   HIV-1 RNA quant-no reflex-bld   T-helper cell (CD4)- ([facility] clinic only)   CBC   Bipolar 1 disorder (HCC)    Cindy Robertson is currently taking Cymbalta and hydroxyzine and per her recollection was prescribed gabapentin for a mood stabilizer.  Recommend psychiatric follow-up for medication management.  I discussed with the pharmacy staff regarding gabapentin and there is no indications for mood stabilization.  Will defer to psychiatry for addition of gabapentin.  Unfortunately she is likely to remain off of her medications at this time secondary to her financial assistance expiring.  It was renewed today.  She informed me she has no money and cannot afford any prescriptions.      Chronic pain of left knee    Cindy Robertson has chronic left knee pain unclear origin although suspect possibly a meniscal tear based on history and description although cannot rule out underlying osteoarthritis.  Will refer to orthopedics for further evaluation and treatment as indicated.  Continue conservative care with ice, heat, and over-the-counter medications as needed for symptom relief and supportive care in the interim.      Relevant Medications   DULoxetine (CYMBALTA) 30  MG capsule   Healthcare maintenance    Discussed importance of safe sexual practice condom usage.  Condoms offered. Discussed/recommended influenza vaccine which was declined.      Other Visit Diagnoses  Screening for STDs (sexually transmitted diseases)       Relevant Orders   RPR   Nausea       Relevant Medications   ondansetron (ZOFRAN-ODT) 4 MG disintegrating tablet        I have changed [employer]. I am also having her maintain her DULoxetine, hydrOXYzine, omeprazole, and ondansetron.   Meds ordered this encounter  Medications   bictegravir-emtricitabine-tenofovir AF (BIKTARVY) 50-200-25 MG TABS tablet    Sig: Take 1 tablet by mouth daily.    Dispense:  30 tablet    Refill:  3    Please fill when UMAP approved.    Order Specific Question:   Supervising Provider    Answer:   Judyann Munson [[id number]]   DULoxetine (CYMBALTA) 30 MG capsule    Sig: TAKE 1 CAPSULE(30 MG) BY MOUTH TWICE DAILY    Dispense:  60 capsule    Refill:  2    Please fill when UMAP approved.    Order Specific Question:   Supervising Provider    Answer:   Judyann Munson [[id number]]   hydrOXYzine (ATARAX/VISTARIL) 50 MG tablet    Sig: TAKE 1 TABLET(50 MG) BY MOUTH THREE TIMES DAILY AS NEEDED FOR ANXIETY    Dispense:  90 tablet    Refill:  3    Please fill when UMAP approved.    Order Specific Question:   Supervising Provider    Answer:   Judyann Munson [[id number]]   omeprazole (PRILOSEC) 40 MG capsule    Sig: TAKE 1 CAPSULE(40 MG) BY MOUTH DAILY    Dispense:  30 capsule    Refill:  3    Please fill when UMAP Approved.    Order Specific Question:   Supervising Provider    Answer:   Judyann Munson [[id number]]   ondansetron (ZOFRAN-ODT) 4 MG disintegrating tablet    Sig: DISSOLVE 1 TABLET(4 MG) ON THE TONGUE EVERY 8 HOURS AS NEEDED FOR NAUSEA OR VOMITING    Dispense:  30 tablet    Refill:  2    Please fill when UMAP approved.    Order Specific Question:   Supervising Provider     Answer:   Judyann Munson [[id number]]      Follow-up: Return in about 3 months (around 04/15/2021), or if symptoms worsen or fail to improve.   Marcos Eke, MSN, FNP-C Nurse Practitioner [facility] Main number: [phone]

## 2021-01-13 NOTE — Patient Instructions (Signed)
Nice to see you.  We will check your lab work today.   Referrals have been sent to psychiatry and orthopedics.   In the meantime for your back and knee - conservative treatment with ice/moist heat x 20 minutes. Can use over the counter medications as needed.   For you chin, clean with soap and water. Can consider triple antibiotic ointment.   Continue to take your medication daily as prescribed.  Plan for follow up in 3 months or sooner if needed.

## 2021-01-13 NOTE — Assessment & Plan Note (Signed)
Ms. Jackowski is currently taking Cymbalta and hydroxyzine and per her recollection was prescribed gabapentin for a mood stabilizer.  Recommend psychiatric follow-up for medication management.  I discussed with the pharmacy staff regarding gabapentin and there is no indications for mood stabilization.  Will defer to psychiatry for addition of gabapentin.  Unfortunately she is likely to remain off of her medications at this time secondary to her financial assistance expiring.  It was renewed today.  She informed me she has no money and cannot afford any prescriptions.

## 2021-01-13 NOTE — Assessment & Plan Note (Addendum)
Cindy Robertson has questionable adherence with good tolerance to her ART regimen of Biktarvy based on refill status.  No signs/symptoms of opportunistic infection.  We reviewed previous lab work and discussed plan of care.  Check blood work today.  Continue current dose of Biktarvy.  Unfortunately her financial assistance expired.  We will provide samples until renewed.  Plan for follow-up in 3 months or sooner if needed with lab work on the same day.

## 2021-01-13 NOTE — Assessment & Plan Note (Signed)
   Discussed importance of safe sexual practice condom usage.  Condoms offered.  Discussed/recommended influenza vaccine which was declined.

## 2021-01-14 ENCOUNTER — Other Ambulatory Visit: Payer: Self-pay | Admitting: Pharmacist

## 2021-01-14 DIAGNOSIS — B20 Human immunodeficiency virus [HIV] disease: Secondary | ICD-10-CM

## 2021-01-14 LAB — T-HELPER CELL (CD4) - (RCID CLINIC ONLY)
CD4 % Helper T Cell: 35 % (ref 33–65)
CD4 T Cell Abs: 685 /uL (ref 400–1790)

## 2021-01-14 MED ORDER — BICTEGRAVIR-EMTRICITAB-TENOFOV 50-200-25 MG PO TABS: 1.0000 | ORAL_TABLET | Freq: Every day | ORAL | 0 refills | Status: AC

## 2021-01-14 NOTE — Progress Notes (Signed)
Medication Samples have been provided to the patient.  Drug name: Biktarvy        Strength: 50/200/25 mg       Qty: 21 tablets (3 bottles) LOT: [id number]   Exp.Date: 10/24  Dosing instructions: Take one tablet by mouth once daily  The patient has been instructed regarding the correct time, dose, and frequency of taking this medication, including desired effects and most common side effects.   Margarite Gouge, PharmD, CPP Clinical Pharmacist Practitioner Infectious Diseases Clinical Pharmacist [facility] Infect[facility]

## 2021-01-15 LAB — CBC
HCT: 38.1 % (ref 35.0–45.0)
Hemoglobin: 11.7 g/dL (ref 11.7–15.5)
MCH: 24.2 pg — ABNORMAL LOW (ref 27.0–33.0)
MCHC: 30.7 g/dL — ABNORMAL LOW (ref 32.0–36.0)
MCV: 78.7 fL — ABNORMAL LOW (ref 80.0–100.0)
MPV: 9.5 fL (ref 7.5–12.5)
Platelets: 484 10*3/uL — ABNORMAL HIGH (ref 140–400)
RBC: 4.84 10*6/uL (ref 3.80–5.10)
RDW: 15.5 % — ABNORMAL HIGH (ref 11.0–15.0)
WBC: 14.6 10*3/uL — ABNORMAL HIGH (ref 3.8–10.8)

## 2021-01-15 LAB — COMPLETE METABOLIC PANEL WITH GFR
AG Ratio: 1.5 (calc) (ref 1.0–2.5)
ALT: 12 U/L (ref 6–29)
AST: 21 U/L (ref 10–30)
Albumin: 4.9 g/dL (ref 3.6–5.1)
Alkaline phosphatase (APISO): 72 U/L (ref 31–125)
BUN/Creatinine Ratio: 20 (calc) (ref 6–22)
BUN: 21 mg/dL (ref 7–25)
CO2: 20 mmol/L (ref 20–32)
Calcium: 9.4 mg/dL (ref 8.6–10.2)
Chloride: 98 mmol/L (ref 98–110)
Creat: 1.05 mg/dL — ABNORMAL HIGH (ref 0.50–0.96)
Globulin: 3.3 g/dL (calc) (ref 1.9–3.7)
Glucose, Bld: 87 mg/dL (ref 65–99)
Potassium: 3.6 mmol/L (ref 3.5–5.3)
Sodium: 133 mmol/L — ABNORMAL LOW (ref 135–146)
Total Bilirubin: 0.5 mg/dL (ref 0.2–1.2)
Total Protein: 8.2 g/dL — ABNORMAL HIGH (ref 6.1–8.1)
eGFR: 74 mL/min/{1.73_m2} (ref >=60)

## 2021-01-15 LAB — RPR: RPR Ser Ql: NONREACTIVE

## 2021-01-15 LAB — HIV-1 RNA QUANT-NO REFLEX-BLD
HIV 1 RNA Quant: <20 Copies/mL — ABNORMAL HIGH
HIV-1 RNA Quant, Log: <1.3 Log cps/mL — ABNORMAL HIGH

## 2021-01-25 ENCOUNTER — Telehealth: Payer: Self-pay

## 2021-01-25 DIAGNOSIS — F319 Bipolar disorder, unspecified: Secondary | ICD-10-CM

## 2021-01-25 NOTE — Telephone Encounter (Signed)
Patient called to follow up on referral to Psychiatry. Advised patient the staff will route message to NP for advise.   Staff also gave information to [facility] counselor to connect patient to [facility] for medication management.   Valarie Cones

## 2021-02-07 ENCOUNTER — Encounter: Payer: Self-pay | Admitting: Family

## 2021-02-22 ENCOUNTER — Ambulatory Visit: Payer: Medicaid Other | Admitting: Orthopaedic Surgery

## 2021-04-28 ENCOUNTER — Ambulatory Visit: Payer: Medicaid Other

## 2021-04-28 ENCOUNTER — Ambulatory Visit: Payer: Self-pay | Admitting: Family

## 2021-06-14 ENCOUNTER — Other Ambulatory Visit: Payer: Self-pay

## 2021-06-14 ENCOUNTER — Encounter: Payer: Self-pay | Admitting: Infectious Diseases

## 2021-06-14 ENCOUNTER — Ambulatory Visit: Payer: Self-pay

## 2021-06-14 ENCOUNTER — Ambulatory Visit (INDEPENDENT_AMBULATORY_CARE_PROVIDER_SITE_OTHER): Payer: Self-pay | Admitting: Infectious Diseases

## 2021-06-14 VITALS — BP 122/76 | HR 78 | Temp 98.7°F | Wt 138.0 lb

## 2021-06-14 DIAGNOSIS — B20 Human immunodeficiency virus [HIV] disease: Secondary | ICD-10-CM

## 2021-06-14 DIAGNOSIS — B182 Chronic viral hepatitis C: Secondary | ICD-10-CM

## 2021-06-14 DIAGNOSIS — R768 Other specified abnormal immunological findings in serum: Secondary | ICD-10-CM

## 2021-06-14 DIAGNOSIS — S22000S Wedge compression fracture of unspecified thoracic vertebra, sequela: Secondary | ICD-10-CM

## 2021-06-14 MED ORDER — BIKTARVY 50-200-25 MG PO TABS: 1.0000 | ORAL_TABLET | Freq: Every day | ORAL | 5 refills | Status: DC

## 2021-06-14 MED ORDER — DULOXETINE HCL 30 MG PO CPEP: ORAL_CAPSULE | ORAL | 2 refills | Status: DC

## 2021-06-14 NOTE — Addendum Note (Signed)
Addended by: Blanchard Kelch on: 06/14/2021 03:59 PM ? ? Modules accepted: Orders ? ?

## 2021-06-14 NOTE — Assessment & Plan Note (Addendum)
Will repeat XRay to see if fracture has healed.  ?Social security office for disability would be where she can start for paperwork, but not clear about the specifics. Will refer her to [facility] for case management assistance for housing, food, substance use (she is pre-contemplative regarding cessation) and other needs. ?Refills for cymbalta provided today.  ?

## 2021-06-14 NOTE — Assessment & Plan Note (Signed)
False positive upon chart review with negative SAg in May 2021. Will check surface antibody for immunity and vaccinate accordingly.  ?

## 2021-06-14 NOTE — Patient Instructions (Addendum)
Nice to meet you - I have sent in refills for your cymbalta and biktarvy  ? ?[facility] has case managers that I want you to work with for some of the things we need to work on for you. I will have them reach out to your mom's phone for now and you can update with other contact information.  ? ?Please go across the hall after your blood work to update your back xray to see if your fracture has healed.   ? ?Can do your pap smear screening to update at your convenience.  ? ?Otherwise can come back to see Marya Amsler again in 3 months.  ? ? ?I want you to consider reaching out to a clinic here in this building on the 3rd floor to help you with getting access to [place] to get more access to health care you need.  ? ?[facility] - [phone] ?Schedule a new patient appointment with one of their providers. Please still plan to come see [place] here for your medications here, but they should be part of your medical team as well.  ?

## 2021-06-14 NOTE — Assessment & Plan Note (Signed)
Her ADAP has expired. Will give 59m samples for her today and refill with request to mail to her mother's address. [facility] referral today for substance use, homelessness and other case management needs. Colin Mulders spoke with her.  ?Pap smear at her convenience - last was during incarceration a few years back.  ?She has been well controlled HIV on biktarvy. Will update labs today and have her back to see Tammy Sours in 32m.  ?

## 2021-06-14 NOTE — Addendum Note (Signed)
Addended by: Marcell Anger on: 06/14/2021 04:30 PM ? ? Modules accepted: Orders ? ?

## 2021-06-14 NOTE — Progress Notes (Signed)
? ?Name: Cindy Robertson  ?DOB: [birth year] ?MRN: [id number] ?PCP: Patient, No Pcp Per (Inactive)  ? ? ?Patient Active Problem List  ? Diagnosis Date Noted  ? Compression fx, thoracic spine, sequela 01/13/2021  ? Bipolar 1 disorder (HCC) 01/13/2021  ? Chronic pain of left knee 01/13/2021  ? Healthcare maintenance 01/13/2021  ? Gastroesophageal reflux disease with esophagitis without hemorrhage 07/02/2020  ? Acute bilateral low back pain without sciatica 07/02/2020  ? Chronic hepatitis C without hepatic coma (HCC) 07/24/2019  ? Hepatitis B surface antigen positive 07/24/2019  ? Depression 07/24/2019  ? HIV (human immunodeficiency virus infection) (HCC) 06/18/2019  ? Drug abuse (HCC) 06/18/2019  ? Toxic metabolic encephalopathy 06/17/2019  ?  ? ?Subjective:  ? ?CC:  ?HIV follow up care. She has been out of mediations.  ? ? ? ?HPI: ?Cindy Robertson is here for follow up on HIV treatment.  ? ?She has multiple complaints today-- requesting back xray and recommendations for how to treat the pain, requesting assistance for disability application.  ? ?She regularly follows with Marcos Eke, NP here at [facility]. She was last seen in the office in November 2022 - Questionable adherence at that time. Financial assistance had expired and she was provided with samples at that time.  ? ?Multiple hospitalizations for substance use / AMS, most recently 05/26/21--> required Narcan for agonal breathing and suspected overdose with good response; urine tox + benzos, amphetamines, cocaine. She was given 5d course of cephalexin for "mild UTI" diagnosed on urinalysis.  ?VL < 20 with TCell count 685 in November 2022.  ? ?Financial assistance has again expired 06/03/21. May have been off Biktarvy now for a month, estimated. She is staying in a shelter currently but OK with her mother getting calls/medications shipped to her house.  ? ? ? ?  01/13/2021  ?  3:26 PM  ?Depression screen PHQ 2/9  ?Decreased Interest 2  ?Down, Depressed, Hopeless 1  ?PHQ - 2  Score 3  ?Altered sleeping 2  ?Tired, decreased energy 3  ?Change in appetite 2  ?Feeling bad or failure about yourself  3  ?Trouble concentrating 3  ?Moving slowly or fidgety/restless 0  ?Suicidal thoughts 0  ?PHQ-9 Score 16  ? ? ?Review of Systems  ?Constitutional:  Negative for chills and fever.  ?Respiratory:  Negative for cough.   ?Cardiovascular:  Negative for chest pain.  ?Musculoskeletal:  Positive for back pain.  ?Skin:  Negative for rash.  ? ? ?Past Medical History:  ?Diagnosis Date  ? Depression   ? Hepatitis B   ? Hepatitis C   ? HIV (human immunodeficiency virus infection) (HCC)   ? ? ?Outpatient Medications Prior to Visit  ?Medication Sig Dispense Refill  ? hydrOXYzine (ATARAX/VISTARIL) 50 MG tablet TAKE 1 TABLET(50 MG) BY MOUTH THREE TIMES DAILY AS NEEDED FOR ANXIETY (Patient not taking: Reported on 06/14/2021) 90 tablet 3  ? omeprazole (PRILOSEC) 40 MG capsule TAKE 1 CAPSULE(40 MG) BY MOUTH DAILY (Patient not taking: Reported on 06/14/2021) 30 capsule 3  ? ondansetron (ZOFRAN-ODT) 4 MG disintegrating tablet DISSOLVE 1 TABLET(4 MG) ON THE TONGUE EVERY 8 HOURS AS NEEDED FOR NAUSEA OR VOMITING (Patient not taking: Reported on 06/14/2021) 30 tablet 2  ? bictegravir-emtricitabine-tenofovir AF (BIKTARVY) 50-200-25 MG TABS tablet Take 1 tablet by mouth daily. (Patient not taking: Reported on 06/14/2021) 30 tablet 3  ? DULoxetine (CYMBALTA) 30 MG capsule TAKE 1 CAPSULE(30 MG) BY MOUTH TWICE DAILY (Patient not taking: Reported on 06/14/2021) 60 capsule 2  ? ?No  facility-administered medications prior to visit.  ?  ? ?Allergies  ?Allergen Reactions  ? Sumatriptan-Naproxen Sodium Other (See Comments) and Shortness Of Breath  ?  Causes tightness in chest ?Patient reports that she has taken ibuprofen before with no problems ?Increased headache ?  ? Tramadol Hives, Itching, Rash and Shortness Of Breath  ? Bactrim [Sulfamethoxazole-Trimethoprim] Other (See Comments)  ?  Per pt: makes her "blood pressure plummet"   ?  Codeine Other (See Comments)  ?  Per pt: makes her feel like she's going to have a heart attack  ? Other   ? Benzalkonium Chloride Rash  ? ? ?Social History  ? ?Tobacco Use  ? Smoking status: Every Day  ?  Packs/day: 0.50  ?  Types: Cigarettes  ? Smokeless tobacco: Current  ? Tobacco comments:  ?  Cutting down   ?Substance Use Topics  ? Alcohol use: Yes  ? Drug use: Yes  ?  Types: Heroin, Methamphetamines, Cocaine, IV  ? ? ?Family History  ?Family history unknown: Yes  ? ? ?Social History  ? ?Substance and Sexual Activity  ?Sexual Activity Not on file  ? ? ? ?Objective:  ? ?Vitals:  ? 06/14/21 1458  ?BP: 122/76  ?Pulse: 78  ?Temp: 98.7 ?F (37.1 ?C)  ?TempSrc: Oral  ?SpO2: 93%  ?Weight: 138 lb (62.6 kg)  ? ?Body mass index is 25.24 kg/m?. ? ?Physical Exam ?Vitals reviewed.  ?Constitutional:   ?   Appearance: Normal appearance. She is not ill-appearing.  ?HENT:  ?   Mouth/Throat:  ?   Mouth: Mucous membranes are moist.  ?   Pharynx: Oropharynx is clear.  ?Eyes:  ?   General: No scleral icterus. ?Pulmonary:  ?   Effort: Pulmonary effort is normal.  ?Neurological:  ?   Mental Status: She is oriented to person, place, and time.  ?Psychiatric:     ?   Mood and Affect: Mood normal.     ?   Thought Content: Thought content normal.  ? ? ?Lab Results ?Lab Results  ?Component Value Date  ? WBC 14.6 (H) 01/13/2021  ? HGB 11.7 01/13/2021  ? HCT 38.1 01/13/2021  ? MCV 78.7 (L) 01/13/2021  ? PLT 484 (H) 01/13/2021  ?  ?Lab Results  ?Component Value Date  ? CREATININE 1.05 (H) 01/13/2021  ? BUN 21 01/13/2021  ? NA 133 (L) 01/13/2021  ? K 3.6 01/13/2021  ? CL 98 01/13/2021  ? CO2 20 01/13/2021  ?  ?Lab Results  ?Component Value Date  ? ALT 12 01/13/2021  ? AST 21 01/13/2021  ? GGT 34 07/24/2019  ? ALKPHOS 54 09/28/2020  ? BILITOT 0.5 01/13/2021  ?  ?No results found for: CHOL, HDL, LDLCALC, LDLDIRECT, TRIG, CHOLHDL ?HIV 1 RNA Quant (Copies/mL)  ?Date Value  ?01/13/2021 <20 (H)  ?07/02/2020 Not Detected  ?02/26/2020 <20  ? ?CD4 T  Cell Abs (/uL)  ?Date Value  ?01/13/2021 685  ?02/26/2020 577  ?08/19/2019 541  ? ? ? ?Assessment & Plan:  ? ?Problem List Items Addressed This Visit   ? ?  ? Unprioritized  ? Chronic hepatitis C without hepatic coma (HCC)  ?  She states she is s/p treatment in the past. Will check RNA level to determine if any re-infection / chronic infection that needs treatment given she is at risk for re-exposure.  ?  ?  ? Relevant Medications  ? bictegravir-emtricitabine-tenofovir AF (BIKTARVY) 50-200-25 MG TABS tablet  ? Other Relevant Orders  ? Hepatitis C  RNA quantitative ([facility])  ? Compression fx, thoracic spine, sequela - Primary  ?  Will repeat XRay to see if fracture has healed.  ?Social security office for disability would be where she can start for paperwork, but not clear about the specifics. Will refer her to [facility] for case management assistance for housing, food, substance use (she is pre-contemplative regarding cessation) and other needs. ?Refills for cymbalta provided today.  ?  ?  ? Relevant Orders  ? DG Thoracic Spine 1 View  ? Hepatitis B surface antigen positive  ?  False positive upon chart review with negative SAg in May 2021. Will check surface antibody for immunity and vaccinate accordingly.  ?  ?  ? HIV (human immunodeficiency virus infection) (HCC)  ?  Her ADAP has expired. Will give 3m samples for her today and refill with request to mail to her mother's address. [facility] referral today for substance use, homelessness and other case management needs. Colin Mulders spoke with her.  ?Pap smear at her convenience - last was during incarceration a few years back.  ?She has been well controlled HIV on biktarvy. Will update labs today and have her back to see Tammy Sours in 6m.  ?  ?  ? Relevant Medications  ? bictegravir-emtricitabine-tenofovir AF (BIKTARVY) 50-200-25 MG TABS tablet  ? Other Relevant Orders  ? HIV 1 RNA quant-no reflex-bld  ? Hepatitis B surface antibody,qualitative  ? ? ?Rexene Alberts, MSN, NP-C ?[facility] ?[facility] ?Pager: [phone] ?Office: [phone] ? ?06/14/21  ?3:50 PM ? ? ?

## 2021-06-14 NOTE — Assessment & Plan Note (Signed)
She states she is s/p treatment in the past. Will check RNA level to determine if any re-infection / chronic infection that needs treatment given she is at risk for re-exposure.  ?

## 2021-06-15 ENCOUNTER — Other Ambulatory Visit: Payer: Self-pay | Admitting: Infectious Diseases

## 2021-06-15 ENCOUNTER — Other Ambulatory Visit: Payer: Self-pay

## 2021-06-15 ENCOUNTER — Other Ambulatory Visit: Payer: Self-pay | Admitting: Family

## 2021-06-15 ENCOUNTER — Ambulatory Visit
Admission: RE | Admit: 2021-06-15 | Discharge: 2021-06-15 | Disposition: A | Discharge status: Home / Self Care | Payer: Medicaid Other | Source: Ambulatory Visit | Attending: Family | Admitting: Family

## 2021-06-15 DIAGNOSIS — B20 Human immunodeficiency virus [HIV] disease: Secondary | ICD-10-CM

## 2021-06-15 DIAGNOSIS — B182 Chronic viral hepatitis C: Secondary | ICD-10-CM

## 2021-06-15 DIAGNOSIS — S22000S Wedge compression fracture of unspecified thoracic vertebra, sequela: Secondary | ICD-10-CM

## 2021-06-15 DIAGNOSIS — M542 Cervicalgia: Secondary | ICD-10-CM

## 2021-06-16 ENCOUNTER — Telehealth: Payer: Self-pay

## 2021-06-16 ENCOUNTER — Other Ambulatory Visit: Payer: Self-pay | Admitting: Pharmacist

## 2021-06-16 DIAGNOSIS — B20 Human immunodeficiency virus [HIV] disease: Secondary | ICD-10-CM

## 2021-06-16 MED ORDER — BIKTARVY 50-200-25 MG PO TABS: 1.0000 | ORAL_TABLET | Freq: Every day | ORAL | 0 refills | Status: AC

## 2021-06-16 NOTE — Telephone Encounter (Signed)
-----   Message from Blanchard Kelch, NP sent at 06/16/2021 10:25 AM EDT ----- ?Please call Cindy Robertson to let her know that her back X-ray shows a mild compression fracture deformity in the same spot from her injury last year. The compression / height of the bone is not worse, which is good.  ?Since it is mild and not worse there may not be too much do surgically but orthopedics would be able to speak to that better than me. Tammy Sours previously referred her. Maybe some physical therapy options but she is too far out that I don?t think a brace would be helpful here.  ? ?Regarding the pain treatment, if she needs something stronger than over the counter naproxen twice a day then referral to pain management is probably the best to offer her. (The drug use history will be problematic for that and they may not take her?)  ? ?I gave her info to enroll in care with the community [facility] and wellness clinic to help with primary care and attaining other community resources including the orange card for insurance coverage. ?

## 2021-06-16 NOTE — Progress Notes (Signed)
Medication Samples have been provided to the patient. ? ?Drug name: Biktarvy        ?Strength: 50/200/25 mg       ?Qty: 28 tablets (4 bottles)   ?LOT: [id number]   ?Exp.Date: 12/2022 ? ?Dosing instructions: Take one tablet by mouth once daily ? ?The patient has been instructed regarding the correct time, dose, and frequency of taking this medication, including desired effects and most common side effects.  ? ?Corban Kistler L. Georgina Krist, PharmD, BCIDP, AAHIVP, CPP ?Clinical Pharmacist Practitioner ?Infectious Diseases Clinical Pharmacist ?[facility] ?02/16/2020, 10:07 AM ? ?

## 2021-06-16 NOTE — Telephone Encounter (Signed)
Called Cindy Robertson and communicated the results regarding her back X-ray. Patient stated understanding and relief that the compression/ height of bone was not worse. ? ?Patient stated that she is still experiencing intermittent back pain. Stated that when the pain starts, it is pretty constant. She was unable to rate the severity of pain, stating it "depends on how bad the swelling gets".  ? ?Cindy Robertson stated she is not taking naproxen for pain management, but is using "regular tylenol" (2 tablets), and "gabapentin" once daily. Stated she believed the gabapentin dose was "215". She stated she is not getting good relief with this regimen, and would appreciate a referral to pain management. ? ?Patient also stated she is interested in another referral to orthopedics, however she was unable to afford the copay with the previous referral. She is asking for more resources with copay assistance. ? ?Cindy Robertson confirmed she did have the orange card information and that she did call [facility] to seek a PCP, and that she is waiting to hear back from them. I explained the PCP can help set up the orange card. Sent patient a link to set up MyChart in order to forward the patient website information on orange card (which explains referral process) and the [facility] financial assistance program. ? ?Patient had no additional questions. ? ?Binnie Kand, RN  ? ?

## 2021-06-17 ENCOUNTER — Telehealth: Payer: Self-pay

## 2021-06-17 NOTE — Telephone Encounter (Signed)
Patient called requesting refills of medication. Advised that Biktarvy and Cymbalta refills were sent, but that she will not be able to get them until ADAP is approved. She has samples of Biktarvy.  ? ?Sandie Ano, RN ? ?

## 2021-06-18 LAB — HEPATITIS B SURFACE ANTIBODY,QUALITATIVE: Hep B S Ab: NONREACTIVE

## 2021-06-18 LAB — HEPATITIS C RNA QUANTITATIVE
HCV Quantitative Log: <1.18 log IU/mL
HCV RNA, PCR, QN: <15 IU/mL

## 2021-06-18 LAB — HIV-1 RNA QUANT-NO REFLEX-BLD
HIV 1 RNA Quant: 385 copies/mL — ABNORMAL HIGH
HIV-1 RNA Quant, Log: 2.59 Log copies/mL — ABNORMAL HIGH

## 2021-06-20 ENCOUNTER — Telehealth: Payer: Self-pay

## 2021-06-20 NOTE — Telephone Encounter (Signed)
Pt called wanting to know which pharmacy her medication was sent to, informed pt per Marcelino Duster that her adap has not yet been renewed and once renewed medication will be sent to pharmacy ?

## 2021-08-02 ENCOUNTER — Other Ambulatory Visit: Payer: Self-pay

## 2021-09-07 ENCOUNTER — Telehealth: Payer: Self-pay

## 2021-09-07 NOTE — Telephone Encounter (Signed)
Patient called stating that the orthopedic office she was referred to doesn't accept her insurance. Patient stated that she would call back Friday to let us know which orthopedic office accepts her insurance.    Jarion Hawthorne Lesli Albee, CMA

## 2021-09-13 ENCOUNTER — Ambulatory Visit (INDEPENDENT_AMBULATORY_CARE_PROVIDER_SITE_OTHER): Payer: Commercial Managed Care - HMO | Admitting: Family

## 2021-09-13 ENCOUNTER — Other Ambulatory Visit: Payer: Self-pay

## 2021-09-13 ENCOUNTER — Encounter: Payer: Self-pay | Admitting: Family

## 2021-09-13 DIAGNOSIS — F1910 Other psychoactive substance abuse, uncomplicated: Secondary | ICD-10-CM | POA: Diagnosis not present

## 2021-09-13 DIAGNOSIS — Z21 Asymptomatic human immunodeficiency virus [HIV] infection status: Secondary | ICD-10-CM | POA: Diagnosis not present

## 2021-09-13 DIAGNOSIS — S22000S Wedge compression fracture of unspecified thoracic vertebra, sequela: Secondary | ICD-10-CM

## 2021-09-13 DIAGNOSIS — F319 Bipolar disorder, unspecified: Secondary | ICD-10-CM

## 2021-09-13 DIAGNOSIS — Z0000 Encounter for general adult medical examination without abnormal findings: Secondary | ICD-10-CM

## 2021-09-13 MED ORDER — GABAPENTIN 100 MG PO CAPS: 100.0000 mg | ORAL_CAPSULE | Freq: Two times a day (BID) | ORAL | 1 refills | Status: DC

## 2021-09-13 NOTE — Assessment & Plan Note (Signed)
Discussed importance of safe sexual practice and condom use.

## 2021-09-13 NOTE — Progress Notes (Signed)
Subjective:    Patient ID: Cindy Robertson, female    DOB: 09/01/1990, 31 y.o.   MRN: [id number]  Chief Complaint  Patient presents with   Follow-up     Virtual Visit via Telephone/Video Note   I connected with Cindy Robertson  on 09/13/2021 at 11:00 AM by telephone and verified that I am speaking with the correct person using two identifiers.   I discussed the limitations, risks, security and privacy concerns of performing an evaluation and management service by telephone and the availability of in person appointments. I also discussed with the patient that there may be a patient responsible charge related to this service. The patient expressed understanding and agreed to proceed.  Location:  Patient: Home in [place], [place] Provider: [facility]  HPI:  Dameisha Tschida is a 31 y.o. female with HIV disease last seen on 06/14/21 by Rexene Alberts, NP with less than optimally controlled virus with viral load of 385 and CD4 count of 685. Her financial assistance expired and was given a month worth of samples of medication. In the interim was seen in the ED with possible overdose on 08/25/21 and was treated with Narcan. Visit changed from in person to tele-health secondary to lack of transportation.  Ms. Holzworth has been taking her Biktarvy as prescribed with no adverse side effects. Requesting gabapentin to help with her mood and potentially help her continued back pain. She was on this previously in addition to her Cymbalta. Was seen at [facility] and is awaiting a referral to Psychiatry. Denies fevers, chills, night sweats, headaches, changes in vision, neck pain/stiffness, nausea, diarrhea, vomiting, lesions or rashes.  Ms. Palla has coverage through [employer]. Denies any current feelings of being down, depressed or hopeless but does have a labile mood. No current recreational or illicit drug use or alcohol consumption. Continues to smoke about 0.5 packs of cigarettes per  day. She has changed addresses recently and has had some problems affording her medications.    Allergies  Allergen Reactions   Sumatriptan-Naproxen Sodium Other (See Comments) and Shortness Of Breath    Causes tightness in chest Patient reports that she has taken ibuprofen before with no problems Increased headache    Tramadol Hives, Itching, Rash and Shortness Of Breath   Bactrim [Sulfamethoxazole-Trimethoprim] Other (See Comments)    Per pt: makes her "blood pressure plummet"    Codeine Other (See Comments)    Per pt: makes her feel like she's going to have a heart attack   Other    Benzalkonium Chloride Rash      Outpatient Medications Prior to Visit  Medication Sig Dispense Refill   bictegravir-emtricitabine-tenofovir AF (BIKTARVY) 50-200-25 MG TABS tablet Take 1 tablet by mouth daily. 30 tablet 5   DULoxetine (CYMBALTA) 30 MG capsule TAKE 1 CAPSULE(30 MG) BY MOUTH TWICE DAILY 60 capsule 2   hydrOXYzine (ATARAX/VISTARIL) 50 MG tablet TAKE 1 TABLET(50 MG) BY MOUTH THREE TIMES DAILY AS NEEDED FOR ANXIETY (Patient not taking: Reported on 06/14/2021) 90 tablet 3   omeprazole (PRILOSEC) 40 MG capsule TAKE 1 CAPSULE(40 MG) BY MOUTH DAILY (Patient not taking: Reported on 06/14/2021) 30 capsule 3   ondansetron (ZOFRAN-ODT) 4 MG disintegrating tablet DISSOLVE 1 TABLET(4 MG) ON THE TONGUE EVERY 8 HOURS AS NEEDED FOR NAUSEA OR VOMITING (Patient not taking: Reported on 06/14/2021) 30 tablet 2   No facility-administered medications prior to visit.     Past Medical History:  Diagnosis Date   Depression    Hepatitis B  Hepatitis C    HIV (human immunodeficiency virus infection) (HCC)      History reviewed. No pertinent surgical history.   Review of Systems  Constitutional:  Negative for appetite change, chills, diaphoresis, fatigue, fever and unexpected weight change.  Eyes:        Negative for acute change in vision  Respiratory:  Negative for chest tightness, shortness of  breath and wheezing.   Cardiovascular:  Negative for chest pain.  Gastrointestinal:  Negative for diarrhea, nausea and vomiting.  Genitourinary:  Negative for dysuria, pelvic pain and vaginal discharge.  Musculoskeletal:  Positive for back pain. Negative for neck pain and neck stiffness.  Skin:  Negative for rash.  Neurological:  Negative for seizures, syncope, weakness and headaches.  Hematological:  Negative for adenopathy. Does not bruise/bleed easily.  Psychiatric/Behavioral:  Negative for hallucinations.       Objective:    Nursing note and vital signs reviewed.    Enora is pleasant to speak with and in no apparent distress. Physical exam is otherwise limited secondary to Tele-health visit.  Assessment & Plan:   Problem List Items Addressed This Visit       Musculoskeletal and Integument   Compression fx, thoracic spine, sequela    Continues to have back pain. Has been seen at [facility] for further evaluation.         Other   HIV (human immunodeficiency virus infection) (HCC) - Primary    Ms. Schnake appears to have adequately controlled virus with improved adherence. Reviewed previous lab work and discussed plan of care. Continue current dose of Biktarvy. Plan for follow up in 1 month or sooner if needed to check lab work. Bus passes will be mailed to help with transportation. [facility] has also been notified to see if there is any assistance they can provide.       Drug abuse (HCC)    Currently denies any recreational or illicit drug use, however recent ED visit was for overdose that was treated with Narcan. This remains a significant problem likely effecting her ability to take her medications effectively. She has an upcoming appointment with Psychiatry.       Bipolar 1 disorder (HCC)    Currently on Cymbalta and previously on gabapentin to help with her mood. Will restart gabapentin 100 mg twice daily with any additional adjustments per Psychiatry.        Healthcare maintenance    Discussed importance of safe sexual practice and condom use.         I am having Cindy Robertson start on gabapentin. I am also having her maintain her hydrOXYzine, omeprazole, ondansetron, Biktarvy, and DULoxetine.   Meds ordered this encounter  Medications   gabapentin (NEURONTIN) 100 MG capsule    Sig: Take 1 capsule (100 mg total) by mouth 2 (two) times daily.    Dispense:  60 capsule    Refill:  1    Order Specific Question:   Supervising Provider    Answer:   Judyann Munson [phone]     I discussed the assessment and treatment plan with the patient. The patient was provided an opportunity to ask questions and all were answered. The patient agreed with the plan and demonstrated an understanding of the instructions.   The patient was advised to call back or seek an in-person evaluation if the symptoms worsen or if the condition fails to improve as anticipated.   I provided  12  minutes of non-face-to-face time during this encounter.  Follow-up: Return in about 1 month (around 10/14/2021), or if symptoms worsen or fail to improve.   Marcos Eke, MSN, FNP-C Nurse Practitioner [facility] Main number: [phone]

## 2021-09-13 NOTE — Assessment & Plan Note (Signed)
Ms. Fuente appears to have adequately controlled virus with improved adherence. Reviewed previous lab work and discussed plan of care. Continue current dose of Biktarvy. Plan for follow up in 1 month or sooner if needed to check lab work. Bus passes will be mailed to help with transportation. [facility] has also been notified to see if there is any assistance they can provide.

## 2021-09-13 NOTE — Assessment & Plan Note (Signed)
Continues to have back pain. Has been seen at [facility] for further evaluation.

## 2021-09-13 NOTE — Patient Instructions (Signed)
Nice to speak with you.  Continue to take your medication daily as prescribed.   New medication has been sent to the pharmacy.   Bus passes will be mailed to your address.   Plan for follow up in 1 month or sooner if needed with lab work on the same day.  Have great day and stay safe!

## 2021-09-13 NOTE — Assessment & Plan Note (Signed)
Currently denies any recreational or illicit drug use, however recent ED visit was for overdose that was treated with Narcan. This remains a significant problem likely effecting her ability to take her medications effectively. She has an upcoming appointment with Psychiatry.

## 2021-09-13 NOTE — Assessment & Plan Note (Signed)
Currently on Cymbalta and previously on gabapentin to help with her mood. Will restart gabapentin 100 mg twice daily with any additional adjustments per Psychiatry.

## 2021-09-14 ENCOUNTER — Ambulatory Visit: Payer: Commercial Managed Care - HMO | Admitting: Family

## 2021-09-19 ENCOUNTER — Telehealth: Payer: Self-pay

## 2021-09-19 NOTE — Telephone Encounter (Signed)
Patient called requesting a refill on Gabapentin. Refills was sent in on 7/11. Patient stated that when moving she lost the prescription she filled recently. I advised patient her insurance wouldn't cover medication until it was time for her next refill. Patient is to call her pharmacy to see how much Gabapentin will be out of pocket.   Bryan Omura Lesli Albee, CMA

## 2021-09-20 NOTE — Telephone Encounter (Signed)
Received fax from [facility] requesting early refill for one time to fill Gabapentin. Spoke with Doyle, FNP is okay with filling one time. Relayed this to pharmacy. Requested they put a note that patient establish care with a PCP if she continues to have issues with maintenance medication.   Juanita Laster, RMA

## 2021-10-12 ENCOUNTER — Other Ambulatory Visit (HOSPITAL_COMMUNITY): Payer: Self-pay

## 2021-10-12 ENCOUNTER — Other Ambulatory Visit: Payer: Self-pay | Admitting: Family

## 2021-10-12 ENCOUNTER — Telehealth: Payer: Self-pay

## 2021-10-12 DIAGNOSIS — K2100 Gastro-esophageal reflux disease with esophagitis, without bleeding: Secondary | ICD-10-CM

## 2021-10-12 NOTE — Telephone Encounter (Signed)
Received call from [facility] stating that they have been trying to contact patient regarding medication delivery. If patient doesn't contact them within 10 days they will close her account.   Called patient mother Golda Acre informed her that Rushie Chestnut has been trying to contact the phone number on file in chart. Santina Evans stated that she would have [employer]. Reminded Santina Evans that patient has appointment with Marcos Eke, NP on 8/11 at 10:30 AM. Will forwad message to Tammy Sours so he can remind patient to call [facility] so her account doesn't get closed.   Masie Bermingham Lesli Albee, CMA

## 2021-10-12 NOTE — Telephone Encounter (Signed)
APPT 8/11

## 2021-10-12 NOTE — Telephone Encounter (Signed)
Noted. Thanks.

## 2021-10-14 ENCOUNTER — Other Ambulatory Visit: Payer: Self-pay

## 2021-10-14 ENCOUNTER — Ambulatory Visit (INDEPENDENT_AMBULATORY_CARE_PROVIDER_SITE_OTHER): Payer: Commercial Managed Care - HMO | Admitting: Family

## 2021-10-14 ENCOUNTER — Encounter: Payer: Self-pay | Admitting: Family

## 2021-10-14 VITALS — BP 110/69 | HR 62 | Temp 98.3°F | Wt 140.0 lb

## 2021-10-14 DIAGNOSIS — Z21 Asymptomatic human immunodeficiency virus [HIV] infection status: Secondary | ICD-10-CM

## 2021-10-14 DIAGNOSIS — Z79899 Other long term (current) drug therapy: Secondary | ICD-10-CM

## 2021-10-14 DIAGNOSIS — F319 Bipolar disorder, unspecified: Secondary | ICD-10-CM

## 2021-10-14 DIAGNOSIS — K2100 Gastro-esophageal reflux disease with esophagitis, without bleeding: Secondary | ICD-10-CM

## 2021-10-14 DIAGNOSIS — Z5181 Encounter for therapeutic drug level monitoring: Secondary | ICD-10-CM | POA: Diagnosis not present

## 2021-10-14 DIAGNOSIS — Z113 Encounter for screening for infections with a predominantly sexual mode of transmission: Secondary | ICD-10-CM

## 2021-10-14 DIAGNOSIS — B182 Chronic viral hepatitis C: Secondary | ICD-10-CM

## 2021-10-14 DIAGNOSIS — R768 Other specified abnormal immunological findings in serum: Secondary | ICD-10-CM

## 2021-10-14 DIAGNOSIS — Z0000 Encounter for general adult medical examination without abnormal findings: Secondary | ICD-10-CM

## 2021-10-14 MED ORDER — DULOXETINE HCL 30 MG PO CPEP: ORAL_CAPSULE | ORAL | 3 refills | Status: DC

## 2021-10-14 MED ORDER — GABAPENTIN 100 MG PO CAPS: 300.0000 mg | ORAL_CAPSULE | Freq: Two times a day (BID) | ORAL | 2 refills | Status: DC

## 2021-10-14 MED ORDER — OMEPRAZOLE 40 MG PO CPDR: DELAYED_RELEASE_CAPSULE | ORAL | 3 refills | Status: DC

## 2021-10-14 NOTE — Assessment & Plan Note (Signed)
Mood appears to be more stabilized with Cymbalta and gabapentin.  Increase gabapentin to 300 mg twice daily and continue current dose of Cymbalta.  No suicidal ideations or signs of psychosis.

## 2021-10-14 NOTE — Assessment & Plan Note (Signed)
Previous lab work in 2021 with positive hepatitis B surface antigen which is no longer present with borderline immunity through hepatitis B surface antibody.  Hepatitis B is covered with Biktarvy containing tenofovir.  Continue to monitor.

## 2021-10-14 NOTE — Assessment & Plan Note (Signed)
Mr. Vessell appears to have improved adherence and good tolerance to Biktarvy.  Reviewed previous lab work and discussed plan of care.  Emphasized importance of taking medication daily as prescribed to reduce risk of progression of disease and/or development of complications.  Check lab work today.  Continue current dose of Biktarvy.  Plan for follow-up in 3 months or sooner if needed with lab work on the same day.

## 2021-10-14 NOTE — Progress Notes (Signed)
Brief Narrative   Patient ID: Cindy Robertson, female    DOB: 1990-03-09, 31 y.o.   MRN: [id number]  Cindy Robertson is a 31 year old Caucasian female with HIV disease diagnosed in May 2021.  Initial CD4 count of 306 and viral load of 165,000.  Risk factors for acquiring HIV includes sexual contact and substance use.  Enters care at [facility] stage II.  Geno sure with no significant medication resistance patterns.  No history of opportunistic infection.  Sole medication regimen of Biktarvy.  Subjective:    Chief Complaint  Patient presents with   Follow-up    HPI:  Cindy Robertson is a 31 y.o. female with HIV disease last seen on 09/13/2021 with questionable adherence and good tolerance to [place] based on refills.  Previous lab work showed a viral load of 385 with CD4 count of 685.  Started on gabapentin for neuropathic pain as well as mood stabilization.  Here today for follow-up.  Cindy Robertson has been taking her medications as prescribed with no significant adverse side effects.  She is established with primary care through [facility] and is seeking additional care for her chronic left knee pain as well as her back and stomach issues.  Requesting increase of gabapentin as it helps but there is a small room for improvement.  Her birthday is tomorrow and she is planning on celebrating with her family. Denies fevers, chills, night sweats, headaches, changes in vision, neck pain/stiffness, nausea, diarrhea, vomiting, lesions or rashes.  Cindy Robertson has Cindy Robertson with no problems obtaining medications from the pharmacy.  Mood is more stable than previous and denies feelings of being down, depressed, or hopeless recently.  Working on improving being sober with no current recreational or illicit drug use, or alcohol consumption.  Does have daily tobacco use.  Condoms offered.  Using transportation through the bus system to attend appointments.   Allergies  Allergen Reactions    Sumatriptan-Naproxen Sodium Other (See Comments) and Shortness Of Breath    Causes tightness in chest Patient reports that she has taken ibuprofen before with no problems Increased headache    Tramadol Hives, Itching, Rash and Shortness Of Breath   Bactrim [Sulfamethoxazole-Trimethoprim] Other (See Comments)    Per pt: makes her "blood pressure plummet"    Codeine Other (See Comments)    Per pt: makes her feel like she's going to have a heart attack   Other    Benzalkonium Chloride Rash      Outpatient Medications Prior to Visit  Medication Sig Dispense Refill   bictegravir-emtricitabine-tenofovir AF (BIKTARVY) 50-200-25 MG TABS tablet Take 1 tablet by mouth daily. 30 tablet 5   DULoxetine (CYMBALTA) 30 MG capsule TAKE 1 CAPSULE(30 MG) BY MOUTH TWICE DAILY 60 capsule 2   gabapentin (NEURONTIN) 100 MG capsule Take 1 capsule (100 mg total) by mouth 2 (two) times daily. 60 capsule 1   omeprazole (PRILOSEC) 40 MG capsule TAKE 1 CAPSULE(40 MG) BY MOUTH DAILY 30 capsule 3   hydrOXYzine (ATARAX/VISTARIL) 50 MG tablet TAKE 1 TABLET(50 MG) BY MOUTH THREE TIMES DAILY AS NEEDED FOR ANXIETY (Patient not taking: Reported on 06/14/2021) 90 tablet 3   ondansetron (ZOFRAN-ODT) 4 MG disintegrating tablet DISSOLVE 1 TABLET(4 MG) ON THE TONGUE EVERY 8 HOURS AS NEEDED FOR NAUSEA OR VOMITING (Patient not taking: Reported on 06/14/2021) 30 tablet 2   No facility-administered medications prior to visit.     Past Medical History:  Diagnosis Date   Depression    Hepatitis B  Hepatitis C    HIV (human immunodeficiency virus infection) (HCC)      History reviewed. No pertinent surgical history.    Review of Systems  Constitutional:  Negative for appetite change, chills, diaphoresis, fatigue, fever and unexpected weight change.  Eyes:        Negative for acute change in vision  Respiratory:  Negative for chest tightness, shortness of breath and wheezing.   Cardiovascular:  Negative for chest pain.   Gastrointestinal:  Negative for diarrhea, nausea and vomiting.  Genitourinary:  Negative for dysuria, pelvic pain and vaginal discharge.  Musculoskeletal:  Negative for neck pain and neck stiffness.  Skin:  Negative for rash.  Neurological:  Negative for seizures, syncope, weakness and headaches.  Hematological:  Negative for adenopathy. Does not bruise/bleed easily.  Psychiatric/Behavioral:  Negative for hallucinations.       Objective:    BP 110/69   Pulse 62   Temp 98.3 F (36.8 C) (Oral)   Wt 140 lb (63.5 kg)   BMI 25.61 kg/m  Nursing note and vital signs reviewed.  Physical Exam Constitutional:      General: She is not in acute distress.    Appearance: She is well-developed.  Eyes:     Conjunctiva/sclera: Conjunctivae normal.  Cardiovascular:     Rate and Rhythm: Normal rate and regular rhythm.     Heart sounds: Normal heart sounds. No murmur heard.    No friction rub. No gallop.  Pulmonary:     Effort: Pulmonary effort is normal. No respiratory distress.     Breath sounds: Normal breath sounds. No wheezing or rales.  Chest:     Chest wall: No tenderness.  Abdominal:     General: Bowel sounds are normal.     Palpations: Abdomen is soft.     Tenderness: There is no abdominal tenderness.  Musculoskeletal:     Cervical back: Neck supple.  Lymphadenopathy:     Cervical: No cervical adenopathy.  Skin:    General: Skin is warm and dry.     Findings: No rash.  Neurological:     Mental Status: She is alert and oriented to person, place, and time.  Psychiatric:        Behavior: Behavior normal.        Thought Content: Thought content normal.        Judgment: Judgment normal.         10/14/2021   10:27 AM 09/13/2021    9:40 AM 01/13/2021    3:26 PM 06/01/2020    3:46 PM  Depression screen PHQ 2/9  Decreased Interest 0 0 2 0  Down, Depressed, Hopeless 0 1 1 0  PHQ - 2 Score 0 1 3 0  Altered sleeping   2   Tired, decreased energy   3   Change in appetite    2   Feeling bad or failure about yourself    3   Trouble concentrating   3   Moving slowly or fidgety/restless   0   Suicidal thoughts   0   PHQ-9 Score   16        Assessment & Plan:    Patient Active Problem List   Diagnosis Date Noted   Compression fx, thoracic spine, sequela 01/13/2021   Bipolar 1 disorder (HCC) 01/13/2021   Chronic pain of left knee 01/13/2021   Healthcare maintenance 01/13/2021   Gastroesophageal reflux disease with esophagitis without hemorrhage 07/02/2020   Acute bilateral low back pain without sciatica  07/02/2020   Chronic hepatitis C without hepatic coma (HCC) 07/24/2019   Hepatitis B surface antigen positive 07/24/2019   Depression 07/24/2019   HIV (human immunodeficiency virus infection) (HCC) 06/18/2019   Drug abuse (HCC) 06/18/2019   Toxic metabolic encephalopathy 06/17/2019     Problem List Items Addressed This Visit       Digestive   Chronic hepatitis C without hepatic coma (HCC)    Most recent lab work completed with no hepatitis C RNA level.  Given continued substance abuse will check periodically.  No treatment necessary at this time.      Gastroesophageal reflux disease with esophagitis without hemorrhage    Cindy Robertson has good adherence and tolerance to omeprazole and has appointment with gastroenterology for further evaluation and treatment.  Continue current dose of omeprazole with changes per gastroenterology.      Relevant Medications   omeprazole (PRILOSEC) 40 MG capsule     Other   HIV (human immunodeficiency virus infection) (HCC) - Primary    Cindy Robertson appears to have improved adherence and good tolerance to [employer].  Reviewed previous lab work and discussed plan of care.  Emphasized importance of taking medication daily as prescribed to reduce risk of progression of disease and/or development of complications.  Check lab work today.  Continue current dose of Biktarvy.  Plan for follow-up in 3 months or sooner if  needed with lab work on the same day.      Relevant Orders   Comprehensive metabolic panel   T-helper cells (CD4) count (not at [facility])   HIV-1 RNA quant-no reflex-bld   Hepatitis B surface antigen positive    Previous lab work in 2021 with positive hepatitis B surface antigen which is no longer present with borderline immunity through hepatitis B surface antibody.  Hepatitis B is covered with Biktarvy containing tenofovir.  Continue to monitor.      Bipolar 1 disorder (HCC)    Mood appears to be more stabilized with Cymbalta and gabapentin.  Increase gabapentin to 300 mg twice daily and continue current dose of Cymbalta.  No suicidal ideations or signs of psychosis.      Healthcare maintenance    Discussed importance of safe sexual practices and condom use.  Condoms offered. Declines vaccinations.      Other Visit Diagnoses     Pharmacologic therapy       Relevant Orders   Lipid panel   Therapeutic drug monitoring       Screening for STDs (sexually transmitted diseases)       Relevant Orders   RPR        I have changed Cindy Robertson's gabapentin. I am also having her maintain her hydrOXYzine, ondansetron, Biktarvy, omeprazole, and DULoxetine.   Meds ordered this encounter  Medications   omeprazole (PRILOSEC) 40 MG capsule    Sig: TAKE 1 CAPSULE(40 MG) BY MOUTH DAILY    Dispense:  30 capsule    Refill:  3    Order Specific Question:   Supervising Provider    Answer:   Drue Second, CYNTHIA [[id number]]   DULoxetine (CYMBALTA) 30 MG capsule    Sig: TAKE 1 CAPSULE(30 MG) BY MOUTH TWICE DAILY    Dispense:  60 capsule    Refill:  3    Order Specific Question:   Supervising Provider    Answer:   Drue Second, CYNTHIA [[id number]]   gabapentin (NEURONTIN) 100 MG capsule    Sig: Take 3 capsules (300 mg total) by mouth 2 (two) times  daily.    Dispense:  60 capsule    Refill:  2    Order Specific Question:   Supervising Provider    Answer:   Judyann Munson [[id number]]     Follow-up: Return  in about 2 months (around 12/14/2021), or if symptoms worsen or fail to improve.   Cindy Eke, MSN, FNP-C Nurse Practitioner [facility] for Infectious Disease [facility] Main number: [phone]

## 2021-10-14 NOTE — Assessment & Plan Note (Signed)
Most recent lab work completed with no hepatitis C RNA level.  Given continued substance abuse will check periodically.  No treatment necessary at this time.

## 2021-10-14 NOTE — Assessment & Plan Note (Signed)
   Discussed importance of safe sexual practices and condom use.  Condoms offered.  Declines vaccinations.

## 2021-10-14 NOTE — Assessment & Plan Note (Signed)
Cindy Robertson has good adherence and tolerance to omeprazole and has appointment with gastroenterology for further evaluation and treatment.  Continue current dose of omeprazole with changes per gastroenterology.

## 2021-10-14 NOTE — Patient Instructions (Addendum)
Nice to see you.  We will check your lab work today.  Continue to take your medication daily as prescribed.  Refills have been sent to the pharmacy.  Plan for follow up in 2 months or sooner if needed with lab work on the same day.  Have a great day and stay safe!  

## 2021-10-17 LAB — HIV-1 RNA QUANT-NO REFLEX-BLD
HIV 1 RNA Quant: <20 Copies/mL — ABNORMAL HIGH
HIV-1 RNA Quant, Log: <1.3 Log cps/mL — ABNORMAL HIGH

## 2021-10-17 LAB — COMPREHENSIVE METABOLIC PANEL
AG Ratio: 1.5 (calc) (ref 1.0–2.5)
ALT: 8 U/L (ref 6–29)
AST: 10 U/L (ref 10–30)
Albumin: 4.7 g/dL (ref 3.6–5.1)
Alkaline phosphatase (APISO): 49 U/L (ref 31–125)
BUN: 14 mg/dL (ref 7–25)
CO2: 23 mmol/L (ref 20–32)
Calcium: 9.5 mg/dL (ref 8.6–10.2)
Chloride: 105 mmol/L (ref 98–110)
Creat: 0.85 mg/dL (ref 0.50–0.96)
Globulin: 3.1 g/dL (calc) (ref 1.9–3.7)
Glucose, Bld: 57 mg/dL — ABNORMAL LOW (ref 65–99)
Potassium: 4 mmol/L (ref 3.5–5.3)
Sodium: 137 mmol/L (ref 135–146)
Total Bilirubin: 0.5 mg/dL (ref 0.2–1.2)
Total Protein: 7.8 g/dL (ref 6.1–8.1)

## 2021-10-17 LAB — LIPID PANEL
Cholesterol: 162 mg/dL (ref <200)
HDL: 51 mg/dL (ref >=50)
LDL Cholesterol (Calc): 93 mg/dL (calc)
Non-HDL Cholesterol (Calc): 111 mg/dL (calc) (ref <130)
Total CHOL/HDL Ratio: 3.2 (calc) (ref <5.0)
Triglycerides: 87 mg/dL (ref <150)

## 2021-10-17 LAB — T-HELPER CELLS (CD4) COUNT (NOT AT ARMC)
Absolute CD4: 692 cells/uL (ref 490–1740)
CD4 T Helper %: 34 % (ref 30–61)
Total lymphocyte count: 2042 cells/uL (ref 850–3900)

## 2021-10-17 LAB — RPR: RPR Ser Ql: NONREACTIVE

## 2021-10-28 ENCOUNTER — Other Ambulatory Visit (HOSPITAL_COMMUNITY): Payer: Self-pay

## 2021-12-13 ENCOUNTER — Other Ambulatory Visit: Payer: Self-pay | Admitting: Family

## 2021-12-14 ENCOUNTER — Ambulatory Visit: Payer: Commercial Managed Care - HMO | Admitting: Family

## 2021-12-14 NOTE — Progress Notes (Deleted)
Brief Narrative   Patient ID: Cindy Robertson, female    DOB: 1990/04/26, 31 y.o.   MRN: [id number]  Cindy Robertson is a 31 year old Caucasian female with HIV disease diagnosed in May 2021.  Initial CD4 count of 306 and viral load of 165,000.  Risk factors for acquiring HIV includes sexual contact and substance use.  Enters care at [facility] stage II.  Geno sure with no significant medication resistance patterns.  No history of opportunistic infection.  Sole medication regimen of Biktarvy.  Subjective:    No chief complaint on file.   HPI:  Cindy Robertson is a 31 y.o. female with HIV disease last seen on 10/14/2021 with improved adherence and good tolerance to [place].  Viral load was undetectable with CD4 count of 692.  Kidney function, liver function, electrolytes within normal ranges.   Allergies  Allergen Reactions   Sumatriptan-Naproxen Sodium Other (See Comments) and Shortness Of Breath    Causes tightness in chest Patient reports that she has taken ibuprofen before with no problems Increased headache    Tramadol Hives, Itching, Rash and Shortness Of Breath   Bactrim [Sulfamethoxazole-Trimethoprim] Other (See Comments)    Per pt: makes her "blood pressure plummet"    Codeine Other (See Comments)    Per pt: makes her feel like she's going to have a heart attack   Other    Benzalkonium Chloride Rash      Outpatient Medications Prior to Visit  Medication Sig Dispense Refill   bictegravir-emtricitabine-tenofovir AF (BIKTARVY) 50-200-25 MG TABS tablet Take 1 tablet by mouth daily. 30 tablet 5   DULoxetine (CYMBALTA) 30 MG capsule TAKE 1 CAPSULE(30 MG) BY MOUTH TWICE DAILY 60 capsule 3   gabapentin (NEURONTIN) 100 MG capsule Take 3 capsules (300 mg total) by mouth 2 (two) times daily. 60 capsule 2   hydrOXYzine (ATARAX/VISTARIL) 50 MG tablet TAKE 1 TABLET(50 MG) BY MOUTH THREE TIMES DAILY AS NEEDED FOR ANXIETY (Patient not taking: Reported on 06/14/2021) 90 tablet 3    omeprazole (PRILOSEC) 40 MG capsule TAKE 1 CAPSULE(40 MG) BY MOUTH DAILY 30 capsule 3   ondansetron (ZOFRAN-ODT) 4 MG disintegrating tablet DISSOLVE 1 TABLET(4 MG) ON THE TONGUE EVERY 8 HOURS AS NEEDED FOR NAUSEA OR VOMITING (Patient not taking: Reported on 06/14/2021) 30 tablet 2   No facility-administered medications prior to visit.     Past Medical History:  Diagnosis Date   Depression    Hepatitis B    Hepatitis C    HIV (human immunodeficiency virus infection) (HCC)      No past surgical history on file.    Review of Systems    Objective:    There were no vitals taken for this visit. Nursing note and vital signs reviewed.  Physical Exam      10/14/2021   10:27 AM 09/13/2021    9:40 AM 01/13/2021    3:26 PM 06/01/2020    3:46 PM  Depression screen PHQ 2/9  Decreased Interest 0 0 2 0  Down, Depressed, Hopeless 0 1 1 0  PHQ - 2 Score 0 1 3 0  Altered sleeping   2   Tired, decreased energy   3   Change in appetite   2   Feeling bad or failure about yourself    3   Trouble concentrating   3   Moving slowly or fidgety/restless   0   Suicidal thoughts   0   PHQ-9 Score   16  Assessment & Plan:    Patient Active Problem List   Diagnosis Date Noted   Compression fx, thoracic spine, sequela 01/13/2021   Bipolar 1 disorder ([place]) 01/13/2021   Chronic pain of left knee 01/13/2021   Healthcare maintenance 01/13/2021   Gastroesophageal reflux disease with esophagitis without hemorrhage 07/02/2020   Acute bilateral low back pain without sciatica 07/02/2020   Chronic hepatitis C without hepatic coma (Cindy Robertson) 07/24/2019   Hepatitis B surface antigen positive 07/24/2019   Depression 07/24/2019   HIV (human immunodeficiency virus infection) (Cindy Robertson) 06/18/2019   Drug abuse (Cindy Robertson) 80/32/1224   Toxic metabolic encephalopathy 82/50/0370     Problem List Items Addressed This Visit   None    I am having Bjorn Loser maintain her hydrOXYzine, ondansetron,  Biktarvy, omeprazole, DULoxetine, and gabapentin.   No orders of the defined types were placed in this encounter.    Follow-up: No follow-ups on file.   Terri Piedra, MSN, FNP-C Nurse Practitioner [facility] for Infectious Disease [place] number: [phone]

## 2022-01-03 ENCOUNTER — Other Ambulatory Visit: Payer: Self-pay | Admitting: Family

## 2022-01-03 ENCOUNTER — Other Ambulatory Visit: Payer: Self-pay

## 2022-01-03 ENCOUNTER — Ambulatory Visit: Payer: Commercial Managed Care - HMO | Admitting: Family

## 2022-01-03 DIAGNOSIS — K2100 Gastro-esophageal reflux disease with esophagitis, without bleeding: Secondary | ICD-10-CM

## 2022-01-03 DIAGNOSIS — B20 Human immunodeficiency virus [HIV] disease: Secondary | ICD-10-CM

## 2022-01-03 MED ORDER — BIKTARVY 50-200-25 MG PO TABS: 1.0000 | ORAL_TABLET | Freq: Every day | ORAL | 2 refills | Status: DC

## 2022-01-04 ENCOUNTER — Other Ambulatory Visit: Payer: Self-pay | Admitting: Infectious Diseases

## 2022-01-04 DIAGNOSIS — B20 Human immunodeficiency virus [HIV] disease: Secondary | ICD-10-CM

## 2022-01-09 ENCOUNTER — Ambulatory Visit: Payer: Commercial Managed Care - HMO | Admitting: Family

## 2022-02-01 ENCOUNTER — Other Ambulatory Visit: Payer: Self-pay | Admitting: Family

## 2022-02-01 DIAGNOSIS — B20 Human immunodeficiency virus [HIV] disease: Secondary | ICD-10-CM

## 2022-02-08 ENCOUNTER — Other Ambulatory Visit: Payer: Self-pay | Admitting: Family

## 2022-02-09 ENCOUNTER — Telehealth: Payer: Self-pay

## 2022-02-09 ENCOUNTER — Other Ambulatory Visit: Payer: Self-pay | Admitting: Family

## 2022-02-09 NOTE — Telephone Encounter (Signed)
Called patient to relay correct dosing for gabapentin, number is not in service.   Sandie Ano, RN

## 2022-02-09 NOTE — Telephone Encounter (Signed)
Patient called requesting refill of gabapentin. Current prescription is for # 60 take 3 capsules BID, equaling a 10 day supply. Patient would like prescription quantity to be updated for a monthly supply. Will route to provider.   Sandie Ano, RN

## 2022-02-10 NOTE — Telephone Encounter (Signed)
Received call from Angus [place], pharmacist with [facility] in [place]. Pharmacist stated patient had requested gabapentin refill, and was calling to clarify dose.  Relayed to pharmacist the correct dosing for gabapentin and advised that our office was trying to reach the patient to communicate this.  Pharmacist attempted to call patient while I was on the line; stated no answer and she left a message. Pharmacist stated she would not send the current gabapentin prescription and would advise patient to follow up with [facility].  Pharmacist provided alternate phone number for patient: [phone]. Called number; no answer. Left message requesting call back.  Wyvonne Lenz, RN

## 2022-03-01 ENCOUNTER — Ambulatory Visit: Payer: Commercial Managed Care - HMO | Admitting: Family

## 2022-03-09 ENCOUNTER — Other Ambulatory Visit: Payer: Self-pay | Admitting: Family

## 2022-03-09 ENCOUNTER — Other Ambulatory Visit: Payer: Self-pay

## 2022-03-09 DIAGNOSIS — F319 Bipolar disorder, unspecified: Secondary | ICD-10-CM

## 2022-03-09 MED ORDER — DULOXETINE HCL 30 MG PO CPEP: ORAL_CAPSULE | ORAL | 0 refills | Status: DC

## 2022-03-17 ENCOUNTER — Other Ambulatory Visit: Payer: Self-pay | Admitting: Family

## 2022-03-17 DIAGNOSIS — F319 Bipolar disorder, unspecified: Secondary | ICD-10-CM

## 2022-03-30 ENCOUNTER — Ambulatory Visit: Payer: Commercial Managed Care - HMO | Admitting: Family

## 2022-03-30 ENCOUNTER — Telehealth: Payer: Self-pay

## 2022-03-30 NOTE — Telephone Encounter (Signed)
Called patient to reschedule missed appointment. Left voicemail requesting call back. Leatrice Jewels, RMA

## 2022-04-12 ENCOUNTER — Other Ambulatory Visit: Payer: Self-pay | Admitting: Family

## 2022-04-12 DIAGNOSIS — F319 Bipolar disorder, unspecified: Secondary | ICD-10-CM

## 2022-04-12 NOTE — Telephone Encounter (Signed)
Please advise if okay to refill. 

## 2022-04-18 ENCOUNTER — Telehealth: Payer: Self-pay

## 2022-04-18 NOTE — Telephone Encounter (Signed)
Received voicemail from patient requesting call back at [phone], no other details provided.   Called patient back at number provided, no answer. Left HIPAA compliant voicemail requesting callback.   Called number listed in chart, wrong number.   Beryle Flock, RN

## 2022-04-24 ENCOUNTER — Ambulatory Visit: Payer: Commercial Managed Care - HMO | Admitting: Pharmacist

## 2022-04-24 ENCOUNTER — Ambulatory Visit: Payer: Commercial Managed Care - HMO | Admitting: Family

## 2022-05-01 ENCOUNTER — Telehealth: Payer: Self-pay

## 2022-05-01 NOTE — Telephone Encounter (Signed)
Received call from Herbert Deaner with [facility], patient is currently in [facility] ED. He will put a note in her chart requesting that patient reach out to [facility] to schedule overdue appointment.   Beryle Flock, RN

## 2022-05-05 ENCOUNTER — Ambulatory Visit: Payer: Commercial Managed Care - HMO | Admitting: Family

## 2022-05-09 ENCOUNTER — Other Ambulatory Visit: Payer: Self-pay | Admitting: Family

## 2022-05-09 DIAGNOSIS — B20 Human immunodeficiency virus [HIV] disease: Secondary | ICD-10-CM

## 2022-05-09 DIAGNOSIS — F319 Bipolar disorder, unspecified: Secondary | ICD-10-CM

## 2022-05-10 ENCOUNTER — Other Ambulatory Visit: Payer: Self-pay | Admitting: Family

## 2022-05-10 DIAGNOSIS — F319 Bipolar disorder, unspecified: Secondary | ICD-10-CM

## 2022-05-22 ENCOUNTER — Ambulatory Visit: Payer: Commercial Managed Care - HMO | Admitting: Family

## 2022-06-08 ENCOUNTER — Other Ambulatory Visit (HOSPITAL_COMMUNITY): Payer: Self-pay

## 2022-06-08 ENCOUNTER — Ambulatory Visit: Payer: Commercial Managed Care - HMO | Admitting: Infectious Diseases

## 2022-06-08 ENCOUNTER — Other Ambulatory Visit: Payer: Self-pay

## 2022-06-08 ENCOUNTER — Ambulatory Visit (INDEPENDENT_AMBULATORY_CARE_PROVIDER_SITE_OTHER): Payer: Medicaid Other | Admitting: Pharmacist

## 2022-06-08 DIAGNOSIS — B20 Human immunodeficiency virus [HIV] disease: Secondary | ICD-10-CM

## 2022-06-08 DIAGNOSIS — R110 Nausea: Secondary | ICD-10-CM

## 2022-06-08 DIAGNOSIS — K2100 Gastro-esophageal reflux disease with esophagitis, without bleeding: Secondary | ICD-10-CM

## 2022-06-08 DIAGNOSIS — F319 Bipolar disorder, unspecified: Secondary | ICD-10-CM | POA: Diagnosis not present

## 2022-06-08 DIAGNOSIS — F419 Anxiety disorder, unspecified: Secondary | ICD-10-CM

## 2022-06-08 MED ORDER — HYDROXYZINE HCL 50 MG PO TABS
50.0000 mg | ORAL_TABLET | Freq: Three times a day (TID) | ORAL | 3 refills | Status: DC | PRN
Start: 2022-06-08 — End: 2022-11-13
  Filled 2022-06-08 – 2022-09-23 (×2): qty 90, 30d supply, fill #0

## 2022-06-08 MED ORDER — DULOXETINE HCL 30 MG PO CPEP
30.0000 mg | ORAL_CAPSULE | Freq: Two times a day (BID) | ORAL | 3 refills | Status: DC
Start: 2022-06-08 — End: 2023-01-03
  Filled 2022-06-08 – 2022-09-23 (×2): qty 60, 30d supply, fill #0

## 2022-06-08 MED ORDER — ONDANSETRON 4 MG PO TBDP
4.0000 mg | ORAL_TABLET | Freq: Three times a day (TID) | ORAL | 2 refills | Status: DC | PRN
Start: 2022-06-08 — End: 2023-12-12
  Filled 2022-06-08: qty 11, 4d supply, fill #0
  Filled 2022-06-08: qty 19, 6d supply, fill #0
  Filled 2022-09-23: qty 30, 10d supply, fill #0

## 2022-06-08 MED ORDER — OMEPRAZOLE 40 MG PO CPDR
40.0000 mg | DELAYED_RELEASE_CAPSULE | Freq: Every day | ORAL | 3 refills | Status: DC
Start: 2022-06-08 — End: 2023-07-27
  Filled 2022-06-08 – 2022-09-23 (×2): qty 30, 30d supply, fill #0

## 2022-06-09 LAB — T-HELPER CELL (CD4) - (RCID CLINIC ONLY)
CD4 % Helper T Cell: 35 % (ref 33–65)
CD4 T Cell Abs: 584 /uL (ref 400–1790)

## 2022-06-09 NOTE — Progress Notes (Signed)
06/09/2022  HPI: Cindy Robertson is a 32 y.o. female who presents to the [facility] pharmacy clinic for HIV follow-up.  Patient Active Problem List   Diagnosis Date Noted   Compression fx, thoracic spine, sequela 01/13/2021   Bipolar 1 disorder 01/13/2021   Chronic pain of left knee 01/13/2021   Healthcare maintenance 01/13/2021   Gastroesophageal reflux disease with esophagitis without hemorrhage 07/02/2020   Acute bilateral low back pain without sciatica 07/02/2020   Chronic hepatitis C without hepatic coma 07/24/2019   Hepatitis B surface antigen positive 07/24/2019   Depression 07/24/2019   HIV (human immunodeficiency virus infection) 06/18/2019   Drug abuse 06/18/2019   Toxic metabolic encephalopathy 06/17/2019    Patient's Medications  New Prescriptions   No medications on file  Previous Medications   BIKTARVY 50-200-25 MG TABS TABLET    TAKE 1 TABLET BY MOUTH DAILY   GABAPENTIN (NEURONTIN) 100 MG CAPSULE    TAKE 3 CAPSULES(300 MG) BY MOUTH TWICE DAILY  Modified Medications   Modified Medication Previous Medication   DULOXETINE (CYMBALTA) 30 MG CAPSULE DULoxetine (CYMBALTA) 30 MG capsule      Take 1 capsule (30 mg total) by mouth 2 (two) times daily.    TAKE 1 CAPSULE(30 MG) BY MOUTH TWICE DAILY   HYDROXYZINE (ATARAX) 50 MG TABLET hydrOXYzine (ATARAX/VISTARIL) 50 MG tablet      Take 1 tablet (50 mg total) by mouth 3 (three) times daily as needed.    TAKE 1 TABLET(50 MG) BY MOUTH THREE TIMES DAILY AS NEEDED FOR ANXIETY   OMEPRAZOLE (PRILOSEC) 40 MG CAPSULE omeprazole (PRILOSEC) 40 MG capsule      Take 1 capsule (40 mg total) by mouth daily.    TAKE 1 CAPSULE(40 MG) BY MOUTH DAILY   ONDANSETRON (ZOFRAN-ODT) 4 MG DISINTEGRATING TABLET ondansetron (ZOFRAN-ODT) 4 MG disintegrating tablet      Take 1 tablet (4 mg total) by mouth every 8 (eight) hours as needed for nausea or vomiting.    DISSOLVE 1 TABLET(4 MG) ON THE TONGUE EVERY 8 HOURS AS NEEDED FOR NAUSEA OR VOMITING  Discontinued  Medications   No medications on file    Allergies: Allergies  Allergen Reactions   Sumatriptan-Naproxen Sodium Other (See Comments) and Shortness Of Breath    Causes tightness in chest Patient reports that she has taken ibuprofen before with no problems Increased headache    Tramadol Hives, Itching, Rash and Shortness Of Breath   Bactrim [Sulfamethoxazole-Trimethoprim] Other (See Comments)    Per pt: makes her "blood pressure plummet"    Codeine Other (See Comments)    Per pt: makes her feel like she's going to have a heart attack   Other    Benzalkonium Chloride Rash    Past Medical History: Past Medical History:  Diagnosis Date   Depression    Hepatitis B    Hepatitis C    HIV (human immunodeficiency virus infection) (HCC)     Social History: Social History   Socioeconomic History   Marital status: Single    Spouse name: Not on file   Number of children: Not on file   Years of education: Not on file   Highest education level: Not on file  Occupational History   Not on file  Tobacco Use   Smoking status: Every Day    Packs/day: .5    Types: Cigarettes   Smokeless tobacco: Current   Tobacco comments:    Cutting down   Substance and Sexual Activity   Alcohol use:  Not Currently   Drug use: Not Currently    Types: Heroin, Methamphetamines, Cocaine, IV   Sexual activity: Not on file  Other Topics Concern   Not on file  Social History Narrative   Not on file   Social Determinants of Health   Financial Resource Strain: Not on file  Food Insecurity: Not on file  Transportation Needs: Not on file  Physical Activity: Not on file  Stress: Not on file  Social Connections: Not on file    Labs: Lab Results  Component Value Date   HIV1RNAQUANT <20 (H) 10/14/2021   HIV1RNAQUANT 385 (H) 06/15/2021   HIV1RNAQUANT <20 (H) 01/13/2021   CD4TABS 584 06/08/2022   CD4TABS 685 01/13/2021   CD4TABS 577 02/26/2020    RPR and STI Lab Results  Component Value Date    LABRPR NON-REACTIVE 10/14/2021   LABRPR NON-REACTIVE 01/13/2021        No data to display          Hepatitis B Lab Results  Component Value Date   HEPBSAB NON-REACTIVE 06/15/2021   HEPBSAG NON-REACTIVE 07/24/2019   HEPBCAB NON-REACTIVE 07/24/2019   Hepatitis C Lab Results  Component Value Date   HCVRNAPCRQN <15 06/15/2021   Hepatitis A Lab Results  Component Value Date   HAV Reactive (A) 06/19/2019   Lipids: Lab Results  Component Value Date   CHOL 162 10/14/2021   TRIG 87 10/14/2021   HDL 51 10/14/2021   CHOLHDL 3.2 10/14/2021   LDLCALC 93 10/14/2021    Current HIV Regimen: Biktarvy  Assessment: Richardson DoppCole comes in today to follow up for her HIV infection after several missed appointments with [facility]. She was last seen in August 2023 where her HIV viral load was undetectable and CD4 count was 692. She states that she is having a rough time right now. She is currently homeless, without a job, and no transportation. She is staying with a friend right now. She does have a case worker with [facility], so I encouraged her to reach out to them to help with housing hopefully. She has been taking her Biktarvy and refill records are consistent with that. She states that she tries her best to remember every day. Stressed the importance of taking it and how resistance develops if doses are missed.   She is asking about her other chronic medications - duloxetine, gabapentin, hydroxyzine, ondansetron, and omeprazole. She insists that her gabapentin dose is 300 mg TID. I stated that Tammy SoursGreg has documented multiple times that she should only be on 100 mg TID. [facility]. I will refill her other medications. Will check labs today and set her up to see Tammy SoursGreg in the next few weeks. Will give her bus passes to use for her return visit. I also gave her 2 bags of food today from our pantry.   Plan: - Continue Biktarvy once daily - Refill  duloxetine, hydroxyzine, ondansetron, and omeprazole - HIV viral load and CD4 today - F/u with Greg on 06/27/22  Winola Drum L. Inola Lisle, PharmD, BCIDP, AAHIVP, CPP Clinical Pharmacist Practitioner Infectious Diseases Clinical Pharmacist [facility] 06/09/2022, 4:30 PM

## 2022-06-10 LAB — HIV-1 RNA QUANT-NO REFLEX-BLD
HIV 1 RNA Quant: <20 Copies/mL — ABNORMAL HIGH
HIV-1 RNA Quant, Log: <1.3 Log cps/mL — ABNORMAL HIGH

## 2022-06-12 ENCOUNTER — Ambulatory Visit: Payer: Commercial Managed Care - HMO | Admitting: Family Medicine

## 2022-06-14 ENCOUNTER — Other Ambulatory Visit: Payer: Self-pay

## 2022-06-15 ENCOUNTER — Other Ambulatory Visit: Payer: Self-pay

## 2022-06-15 ENCOUNTER — Telehealth: Payer: Self-pay

## 2022-06-15 DIAGNOSIS — B20 Human immunodeficiency virus [HIV] disease: Secondary | ICD-10-CM

## 2022-06-15 MED ORDER — BIKTARVY 50-200-25 MG PO TABS
1.0000 | ORAL_TABLET | Freq: Every day | ORAL | 2 refills | Status: DC
Start: 2022-06-15 — End: 2023-06-04

## 2022-06-15 NOTE — Telephone Encounter (Signed)
Received call from patient's mother stating that Cindy Robertson is in custody and not receiving her medication.   Confirmed with [facility] that she is in custody. Biktarvy should be sent to [facility] in [place].   Refills sent.   Sandie Ano, RN

## 2022-06-27 ENCOUNTER — Ambulatory Visit: Payer: Commercial Managed Care - HMO | Admitting: Family

## 2022-07-12 ENCOUNTER — Encounter: Payer: Medicaid Other | Admitting: Nurse Practitioner

## 2022-07-20 ENCOUNTER — Ambulatory Visit: Payer: Medicaid Other | Admitting: Family

## 2022-08-17 ENCOUNTER — Ambulatory Visit: Payer: Medicaid Other | Admitting: Gastroenterology

## 2022-09-23 ENCOUNTER — Other Ambulatory Visit (HOSPITAL_COMMUNITY): Payer: Self-pay

## 2022-09-25 ENCOUNTER — Other Ambulatory Visit: Payer: Self-pay

## 2022-09-25 ENCOUNTER — Other Ambulatory Visit (HOSPITAL_COMMUNITY): Payer: Self-pay

## 2022-09-26 ENCOUNTER — Other Ambulatory Visit: Payer: Self-pay

## 2022-09-29 ENCOUNTER — Other Ambulatory Visit: Payer: Self-pay

## 2022-11-13 ENCOUNTER — Other Ambulatory Visit: Payer: Self-pay | Admitting: Family

## 2022-11-13 DIAGNOSIS — F419 Anxiety disorder, unspecified: Secondary | ICD-10-CM

## 2022-11-13 NOTE — Telephone Encounter (Signed)
Attempted to contact patient - no VM set up to leave a message

## 2022-11-13 NOTE — Telephone Encounter (Signed)
Okay to refill? 

## 2023-01-02 ENCOUNTER — Other Ambulatory Visit: Payer: Self-pay | Admitting: Family

## 2023-01-02 DIAGNOSIS — F319 Bipolar disorder, unspecified: Secondary | ICD-10-CM

## 2023-01-02 NOTE — Telephone Encounter (Signed)
Okay to refill? Will send mychart message.

## 2023-02-13 ENCOUNTER — Other Ambulatory Visit: Payer: Self-pay | Admitting: Family

## 2023-02-13 DIAGNOSIS — F419 Anxiety disorder, unspecified: Secondary | ICD-10-CM

## 2023-02-13 DIAGNOSIS — K2100 Gastro-esophageal reflux disease with esophagitis, without bleeding: Secondary | ICD-10-CM

## 2023-02-20 ENCOUNTER — Ambulatory Visit: Payer: Medicaid Other | Admitting: Infectious Diseases

## 2023-06-03 ENCOUNTER — Other Ambulatory Visit: Payer: Self-pay | Admitting: Family

## 2023-06-03 DIAGNOSIS — B20 Human immunodeficiency virus [HIV] disease: Secondary | ICD-10-CM

## 2023-06-04 ENCOUNTER — Other Ambulatory Visit: Payer: Self-pay

## 2023-06-04 DIAGNOSIS — B20 Human immunodeficiency virus [HIV] disease: Secondary | ICD-10-CM

## 2023-06-04 MED ORDER — BIKTARVY 50-200-25 MG PO TABS: 1.0000 | ORAL_TABLET | Freq: Every day | ORAL | 1 refills | Status: DC

## 2023-06-10 IMAGING — DX DG CERVICAL SPINE 2 OR 3 VIEWS
3 series · 3 of 3 positions shown · non-contrast
Comparison: 09/28/2020 CT cervical spine

CLINICAL DATA: Upper extremity paresthesias

EXAM:
CERVICAL SPINE - 2-3 VIEW

[dg cervical spine 2 or 3 views (1 of 3)]
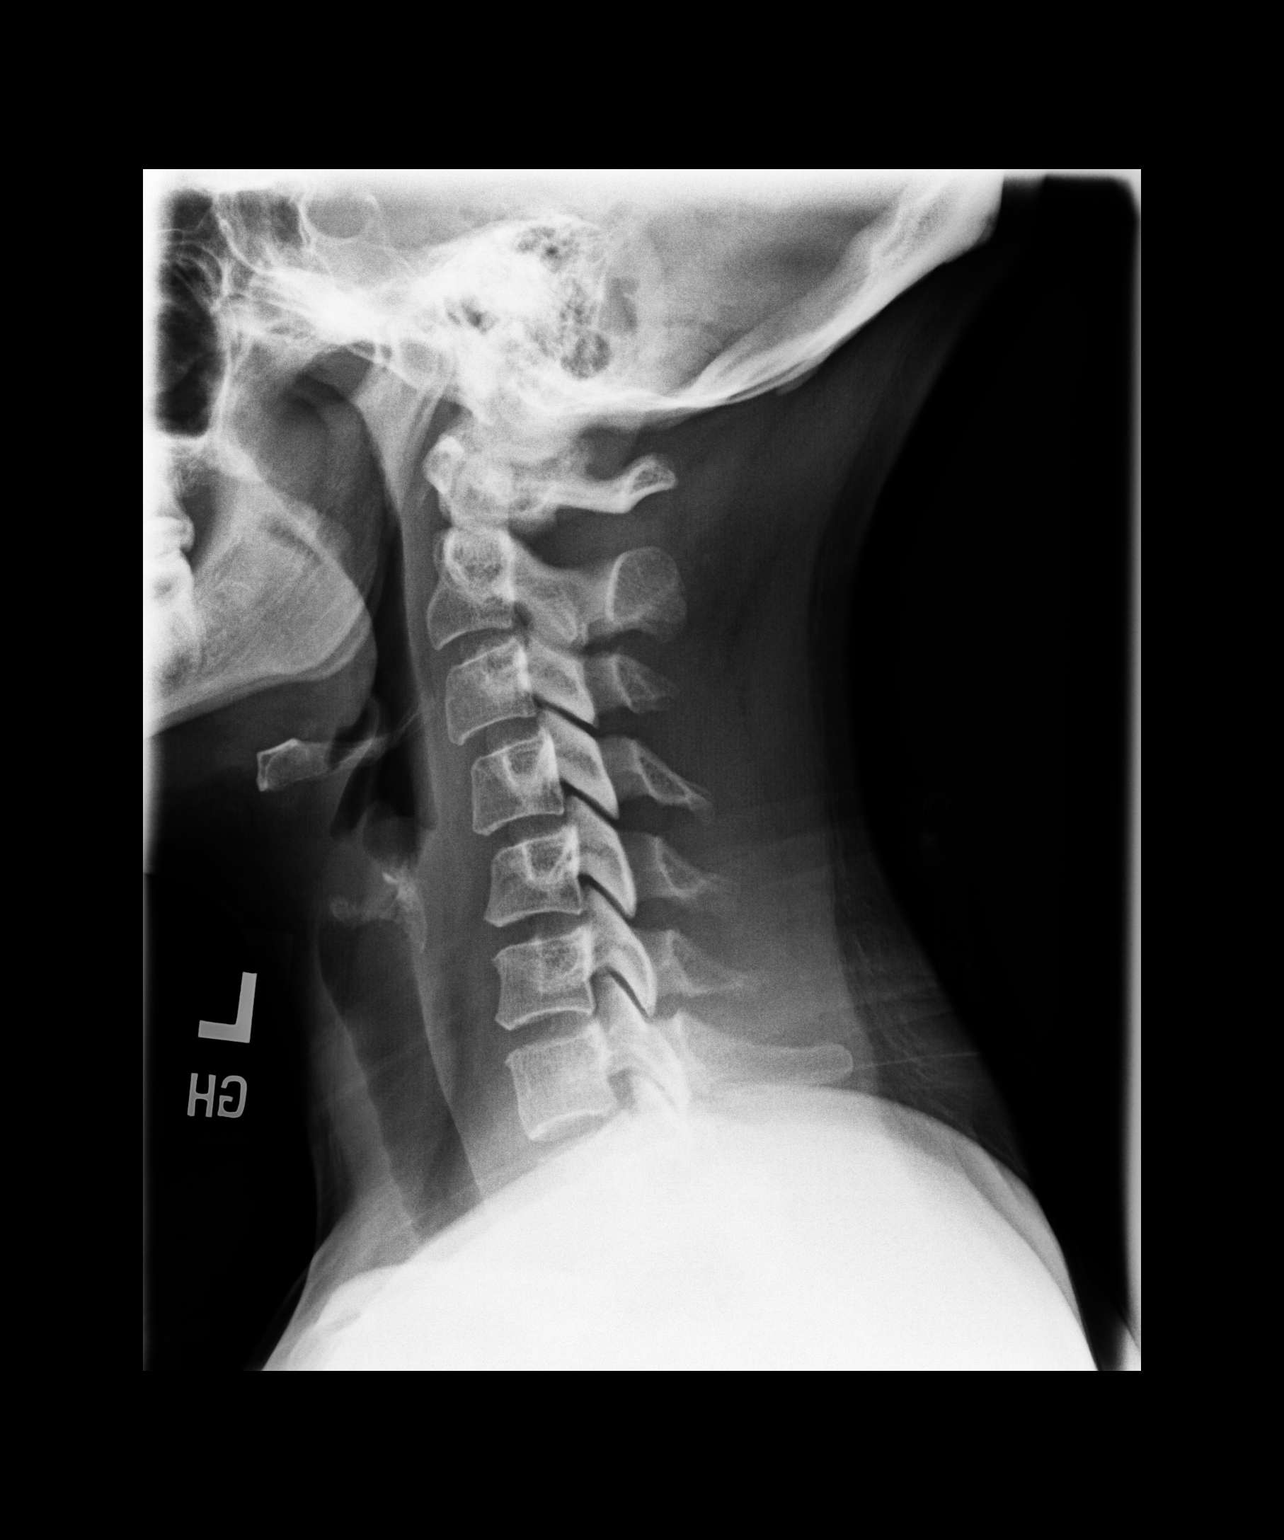

[dg cervical spine 2 or 3 views (2 of 3)]
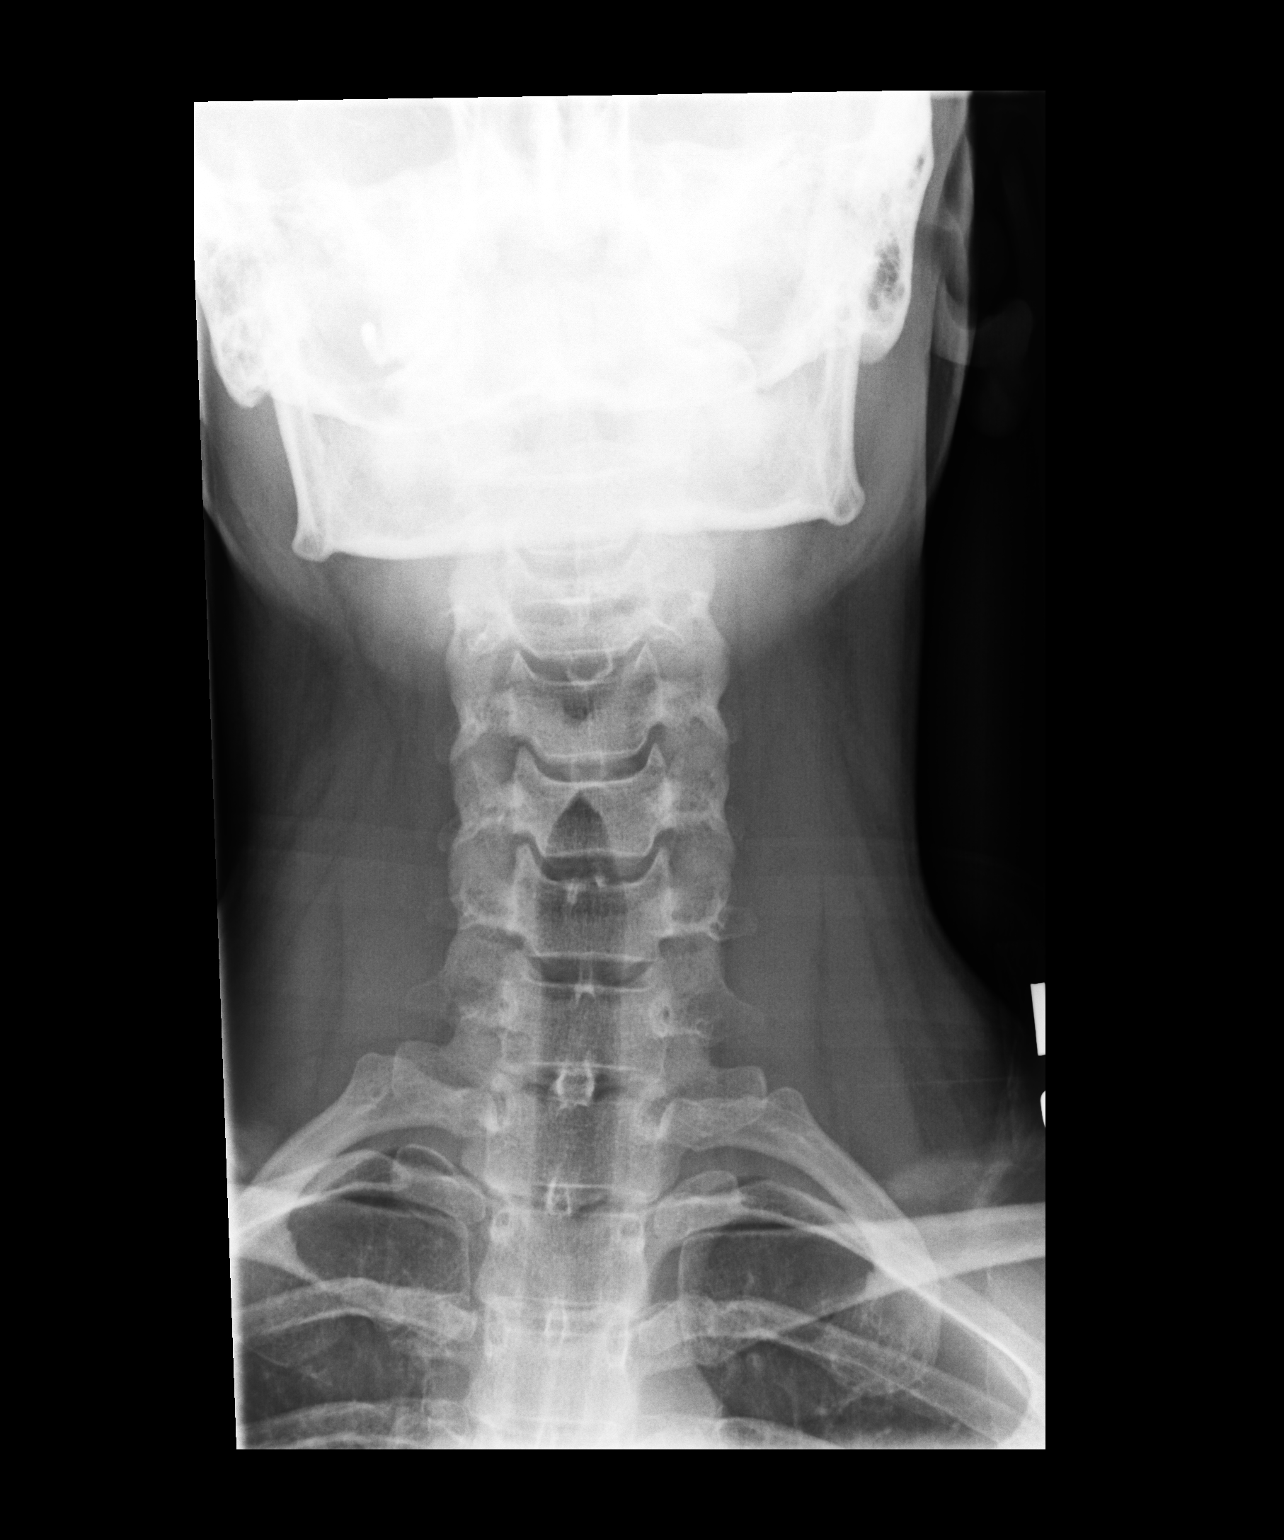

[dg cervical spine 2 or 3 views (3 of 3)]
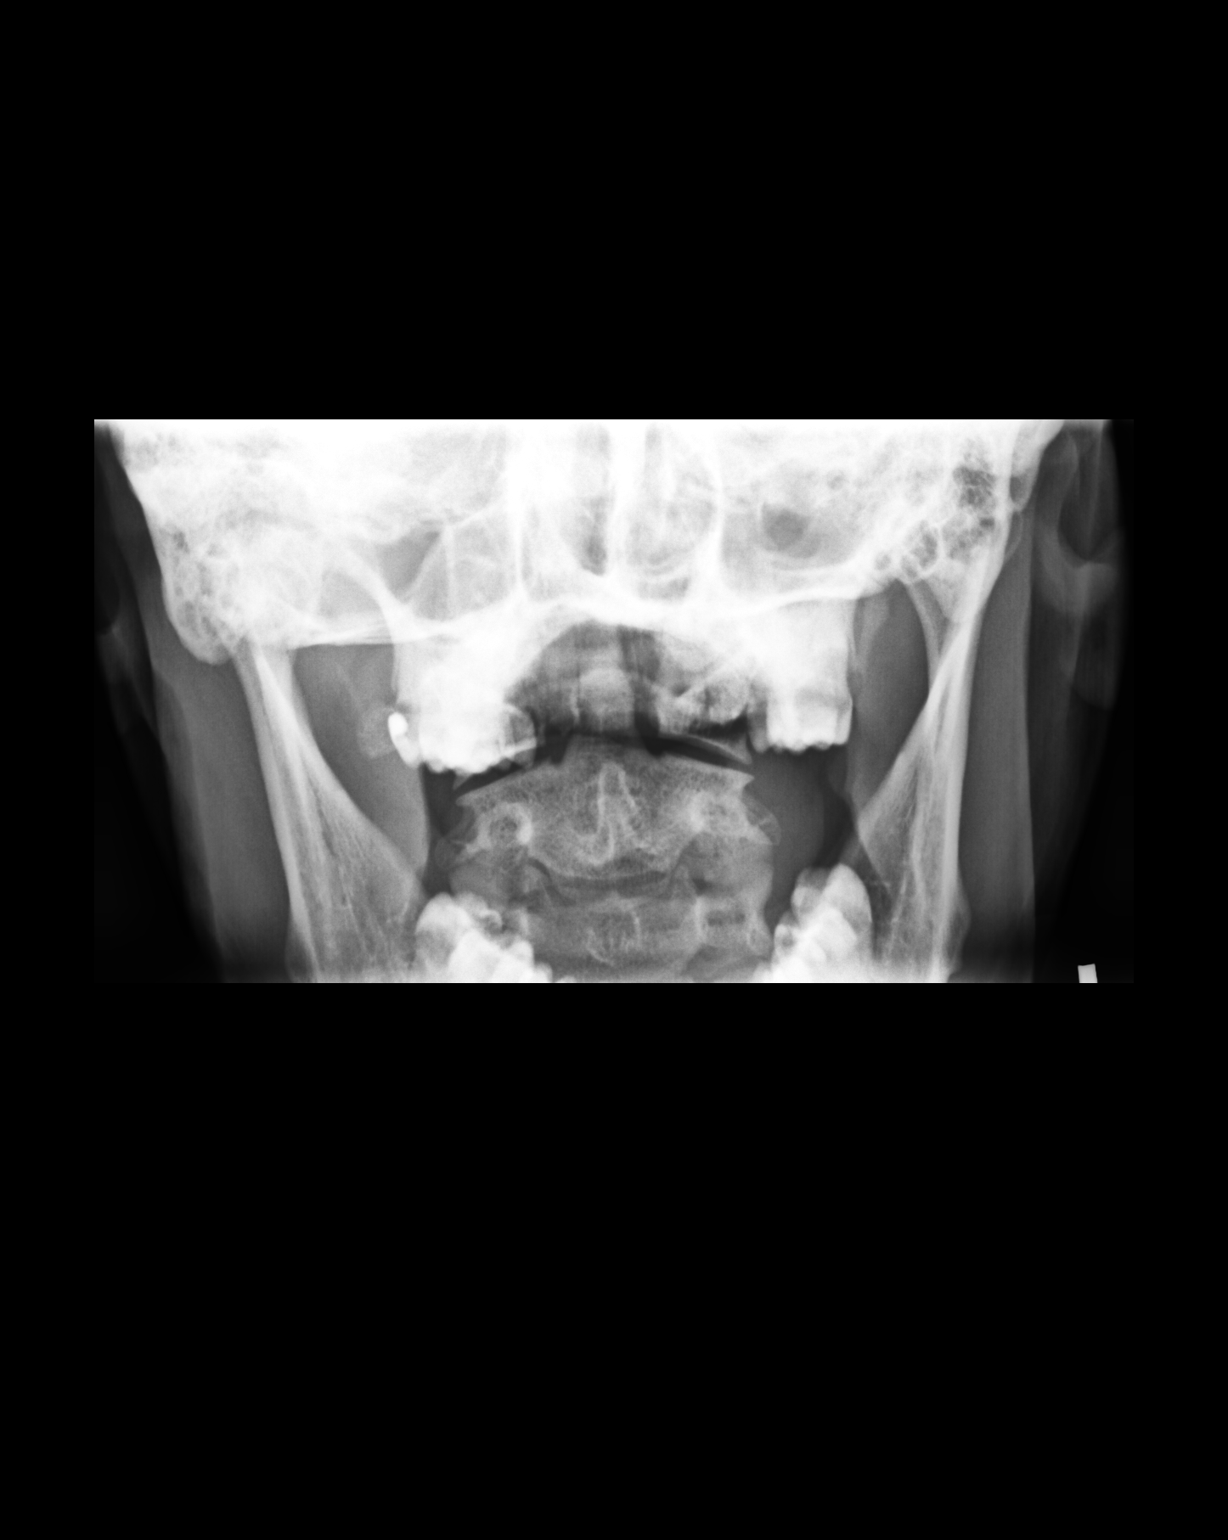

[3 of 3 positions shown; findings below may reference images not displayed]

FINDINGS: There is no evidence of cervical spine fracture or prevertebral soft
tissue swelling. Alignment is normal. No other significant bone
abnormalities are identified.
IMPRESSION: Negative cervical spine radiographs.

## 2023-07-27 ENCOUNTER — Encounter: Payer: Self-pay | Admitting: Family

## 2023-07-27 ENCOUNTER — Other Ambulatory Visit: Payer: Self-pay

## 2023-07-27 ENCOUNTER — Ambulatory Visit (INDEPENDENT_AMBULATORY_CARE_PROVIDER_SITE_OTHER): Admitting: Family

## 2023-07-27 VITALS — BP 110/75 | HR 66 | Temp 98.0°F

## 2023-07-27 DIAGNOSIS — Z23 Encounter for immunization: Secondary | ICD-10-CM | POA: Diagnosis not present

## 2023-07-27 DIAGNOSIS — K2100 Gastro-esophageal reflux disease with esophagitis, without bleeding: Secondary | ICD-10-CM | POA: Diagnosis not present

## 2023-07-27 DIAGNOSIS — B20 Human immunodeficiency virus [HIV] disease: Secondary | ICD-10-CM

## 2023-07-27 DIAGNOSIS — S22000S Wedge compression fracture of unspecified thoracic vertebra, sequela: Secondary | ICD-10-CM | POA: Diagnosis not present

## 2023-07-27 DIAGNOSIS — F1910 Other psychoactive substance abuse, uncomplicated: Secondary | ICD-10-CM | POA: Diagnosis not present

## 2023-07-27 DIAGNOSIS — Z113 Encounter for screening for infections with a predominantly sexual mode of transmission: Secondary | ICD-10-CM

## 2023-07-27 DIAGNOSIS — Z21 Asymptomatic human immunodeficiency virus [HIV] infection status: Secondary | ICD-10-CM

## 2023-07-27 DIAGNOSIS — Z0000 Encounter for general adult medical examination without abnormal findings: Secondary | ICD-10-CM

## 2023-07-27 MED ORDER — OMEPRAZOLE 40 MG PO CPDR: 40.0000 mg | DELAYED_RELEASE_CAPSULE | Freq: Every day | ORAL | 3 refills | Status: DC

## 2023-07-27 MED ORDER — BIKTARVY 50-200-25 MG PO TABS: 1.0000 | ORAL_TABLET | Freq: Every day | ORAL | 3 refills | Status: DC

## 2023-07-27 MED ORDER — GABAPENTIN 300 MG PO CAPS: 300.0000 mg | ORAL_CAPSULE | Freq: Two times a day (BID) | ORAL | 1 refills | Status: DC

## 2023-07-27 NOTE — Assessment & Plan Note (Addendum)
 Continues to have chronic back pain and is interested in following up with pain management for further evaluation and interventions. Previously on gabapentin  for neuropathic pain and also with mood. Will restart gabapentin  for now. Referral placed.

## 2023-07-27 NOTE — Assessment & Plan Note (Signed)
 Site specific STD testing offered.  Vaccinations reviewed with Prevnar 20 updated Due for routine dental care and can refer to [facility] if needed. Due for cervical cancer screening and can complete here with Stephanie Dixon, NP or Gyn.

## 2023-07-27 NOTE — Progress Notes (Signed)
 Brief Narrative   Patient ID: Cindy Robertson, female    DOB: November 10, 1990, 33 y.o.   MRN: [id number]  Cindy Robertson is a 33 year old Caucasian female with HIV disease diagnosed in May 2021.  Initial CD4 count of 306 and viral load of 165,000.  Risk factors for acquiring HIV includes sexual contact and substance use.  Enters care at [facility] stage II.  Geno sure with no significant medication resistance patterns.  No history of opportunistic infection.  Sole medication regimen of Biktarvy .   Subjective:   Chief Complaint  Patient presents with   Follow-up    B20    HPI:  Cindy Robertson is a 33 y.o. female with HIV disease last seen on 06/28/22 by Sandford Croon, PharmD, CPP with well-controlled by recent good adherence and tolerance to Biktarvy .  Viral load was undetectable with CD4 count 584.  Struggling with homelessness and transportation issues at the time.  Has not been seen since that day and missed several appointments.  Here today for follow-up.  Cindy Robertson is currently incarcerated and present with the [facility] deputies.  Plans for release on Monday.  Has been taking Biktarvy  as prescribed with no adverse side effects or problems getting medication.  Had missed a period of medication prior to being incarcerated.  Conitnues to have low back pain following a previous fall and has neuropathic related pain that she was taking gabapentin  for. Requesting referral to pain management and also for suboxone for opioid use disorder. Requesting refill of omeprazole  for GERD. Covered by [facility] and has had issues with obtaining medication secondary to identity theft.  Currently remains homeless and is working with the reentry program to find housing. Healthcare maintenance reviewed. Condoms and site specific STD testing offered.   Denies fevers, chills, night sweats, headaches, changes in vision, neck pain/stiffness, nausea, diarrhea, vomiting, lesions or rashes.  Lab  Results  Component Value Date   CD4TCELL 35 06/08/2022   CD4TABS 584 06/08/2022   Lab Results  Component Value Date   HIV1RNAQUANT <20 (H) 06/08/2022     Allergies  Allergen Reactions   Sumatriptan-Naproxen Sodium Other (See Comments) and Shortness Of Breath    Causes tightness in chest Patient reports that she has taken ibuprofen before with no problems Increased headache    Tramadol Hives, Itching, Rash and Shortness Of Breath   Bactrim [Sulfamethoxazole-Trimethoprim] Other (See Comments)    Per pt: makes her "blood pressure plummet"    Codeine Other (See Comments)    Per pt: makes her feel like she's going to have a heart attack   Other    Benzalkonium Chloride Rash      Outpatient Medications Prior to Visit  Medication Sig Dispense Refill   DULoxetine  (CYMBALTA ) 30 MG capsule TAKE 1 CAPSULE BY MOUTH TWICE DAILY (Patient taking differently: Take 60 mg by mouth 2 (two) times daily.) 60 capsule 0   famotidine (PEPCID) 20 MG tablet Take 20 mg by mouth 2 (two) times daily.     hydrOXYzine  (ATARAX ) 50 MG tablet Take 1 tablet (50 mg total) by mouth 3 (three) times daily as needed. NEED APPOINTMENT FOR FUTURE REFILLS. 90 tablet 0   bictegravir-emtricitabine -tenofovir  AF (BIKTARVY ) 50-200-25 MG TABS tablet Take 1 tablet by mouth daily. 30 tablet 1   ondansetron  (ZOFRAN -ODT) 4 MG disintegrating tablet Take 1 tablet (4 mg total) by mouth every 8 (eight) hours as needed for nausea or vomiting. (Patient not taking: Reported on 07/27/2023) 30 tablet 2   gabapentin  (NEURONTIN )  100 MG capsule TAKE 3 CAPSULES(300 MG) BY MOUTH TWICE DAILY (Patient not taking: Reported on 07/27/2023) 60 capsule 2   omeprazole  (PRILOSEC) 40 MG capsule Take 1 capsule (40 mg total) by mouth daily. (Patient not taking: Reported on 07/27/2023) 30 capsule 3   No facility-administered medications prior to visit.     Past Medical History:  Diagnosis Date   Depression    Hepatitis B    Hepatitis C    HIV (human  immunodeficiency virus infection) (HCC)      History reviewed. No pertinent surgical history.      Review of Systems  Constitutional:  Negative for appetite change, chills, diaphoresis, fatigue, fever and unexpected weight change.  Eyes:        Negative for acute change in vision  Respiratory:  Negative for chest tightness, shortness of breath and wheezing.   Cardiovascular:  Negative for chest pain.  Gastrointestinal:  Negative for diarrhea, nausea and vomiting.  Genitourinary:  Negative for dysuria, pelvic pain and vaginal discharge.  Musculoskeletal:  Positive for arthralgias and back pain. Negative for neck pain and neck stiffness.  Skin:  Negative for rash.  Neurological:  Negative for seizures, syncope, weakness and headaches.  Hematological:  Negative for adenopathy. Does not bruise/bleed easily.  Psychiatric/Behavioral:  Negative for hallucinations.      Objective:   BP 110/75   Pulse 66   Temp 98 F (36.7 C) (Oral)   SpO2 95%  Nursing note and vital signs reviewed.  Physical Exam Constitutional:      General: She is not in acute distress.    Appearance: She is well-developed.  Eyes:     Conjunctiva/sclera: Conjunctivae normal.  Cardiovascular:     Rate and Rhythm: Normal rate and regular rhythm.     Heart sounds: Normal heart sounds. No murmur heard.    No friction rub. No gallop.  Pulmonary:     Effort: Pulmonary effort is normal. No respiratory distress.     Breath sounds: Normal breath sounds. No wheezing or rales.  Chest:     Chest wall: No tenderness.  Abdominal:     General: Bowel sounds are normal.     Palpations: Abdomen is soft.     Tenderness: There is no abdominal tenderness.  Musculoskeletal:     Cervical back: Neck supple.  Lymphadenopathy:     Cervical: No cervical adenopathy.  Skin:    General: Skin is warm and dry.     Findings: No rash.  Neurological:     Mental Status: She is alert and oriented to person, place, and time.   Psychiatric:        Attention and Perception: Attention normal.        Speech: Speech normal.        Behavior: Behavior normal.        Thought Content: Thought content normal.        Cognition and Memory: Cognition normal.        Judgment: Judgment normal.          10/14/2021   10:27 AM 09/13/2021    9:40 AM 01/13/2021    3:26 PM 06/01/2020    3:46 PM  Depression screen PHQ 2/9  Decreased Interest 0 0 2 0  Down, Depressed, Hopeless 0 1 1 0  PHQ - 2 Score 0 1 3 0  Altered sleeping   2   Tired, decreased energy   3   Change in appetite   2   Feeling bad  or failure about yourself    3   Trouble concentrating   3   Moving slowly or fidgety/restless   0   Suicidal thoughts   0   PHQ-9 Score   16          No data to display           The ASCVD Risk score (Arnett DK, et al., 2019) failed to calculate for the following reasons:   The 2019 ASCVD risk score is only valid for ages 62 to 87      Assessment & Plan:    Patient Active Problem List   Diagnosis Date Noted   Compression fx, thoracic spine, sequela 01/13/2021   Bipolar 1 disorder (HCC) 01/13/2021   Chronic pain of left knee 01/13/2021   Healthcare maintenance 01/13/2021   Gastroesophageal reflux disease with esophagitis without hemorrhage 07/02/2020   Acute bilateral low back pain without sciatica 07/02/2020   Chronic hepatitis C without hepatic coma (HCC) 07/24/2019   Hepatitis B surface antigen positive 07/24/2019   Depression 07/24/2019   HIV (human immunodeficiency virus infection) (HCC) 06/18/2019   Drug abuse (HCC) 06/18/2019   Toxic metabolic encephalopathy 06/17/2019     Problem List Items Addressed This Visit       Digestive   Gastroesophageal reflux disease with esophagitis without hemorrhage   Previously maintained on omeprazole  with no adverse side effects and good control of symptoms. Restart omeprazole .       Relevant Medications   omeprazole  (PRILOSEC) 40 MG capsule      Musculoskeletal and Integument   Compression fx, thoracic spine, sequela   Continues to have chronic back pain and is interested in following up with pain management for further evaluation and interventions. Previously on gabapentin  for neuropathic pain and also with mood. Will restart gabapentin  for now. Referral placed.       Relevant Orders   Ambulatory referral to Pain Clinic     Other   HIV (human immunodeficiency virus infection) (HCC)   Cindy Robertson appears to have well controlled virus with good adherence and tolerance to Biktarvy . Reviewed previous lab work and counseled on importance of follow up office visits and taking medication as prescribed to reduce risk of disease progression or complications. Check lab work. Covered by [facility]. Continue current dose of Biktarvy . Plan for follow up in 1 month or sooner if needed.       Relevant Medications   bictegravir-emtricitabine -tenofovir  AF (BIKTARVY ) 50-200-25 MG TABS tablet   Other Relevant Orders   Comprehensive metabolic panel with GFR   HIV-1 RNA quant-no reflex-bld   T-helper cells (CD4) count (not at [facility])   Drug abuse [facility])   Has been sober and looking to establish for suboxone and treatment of opoid use disorder. Will evaluate resources.      Healthcare maintenance   Site specific STD testing offered.  Vaccinations reviewed with Prevnar 20 updated Due for routine dental care and can refer to [facility] if needed. Due for cervical cancer screening and can complete here with Cindy Dixon, NP or Gyn.       Other Visit Diagnoses       Screening for STDs (sexually transmitted diseases)    -  Primary   Relevant Orders   RPR     Need for pneumococcal 20-valent conjugate vaccination       Relevant Orders   Pneumococcal conjugate vaccine 20-valent (Completed)        I have changed Cindy Starr "Cole"'s gabapentin . I am  also having her maintain her ondansetron , hydrOXYzine , DULoxetine , famotidine, Biktarvy , and  omeprazole .   Meds ordered this encounter  Medications   bictegravir-emtricitabine -tenofovir  AF (BIKTARVY ) 50-200-25 MG TABS tablet    Sig: Take 1 tablet by mouth daily.    Dispense:  30 tablet    Refill:  3    Please fill on 07/31/23 - has [facility]    Supervising Provider:   Liane Robertson [phone]    Prescription Type::   Renewal   omeprazole  (PRILOSEC) 40 MG capsule    Sig: Take 1 capsule (40 mg total) by mouth daily.    Dispense:  30 capsule    Refill:  3    Please fill on 07/31/23    Supervising Provider:   SNIDER, Cindy [phone]   gabapentin  (NEURONTIN ) 300 MG capsule    Sig: Take 1 capsule (300 mg total) by mouth 2 (two) times daily.    Dispense:  60 capsule    Refill:  1    Please fill on 07/2723    Supervising Provider:   Liane Robertson [[id number]]     Follow-up: Return in about 1 month (around 08/27/2023). or sooner if needed.    Cindy Silva, MSN, FNP-C Nurse Practitioner [facility] for Infectious Disease [facility] Main number: [phone]

## 2023-07-27 NOTE — Assessment & Plan Note (Signed)
 Previously maintained on omeprazole  with no adverse side effects and good control of symptoms. Restart omeprazole .

## 2023-07-27 NOTE — Assessment & Plan Note (Signed)
 Cindy Robertson appears to have well controlled virus with good adherence and tolerance to Biktarvy . Reviewed previous lab work and counseled on importance of follow up office visits and taking medication as prescribed to reduce risk of disease progression or complications. Check lab work. Covered by [facility]. Continue current dose of Biktarvy . Plan for follow up in 1 month or sooner if needed.

## 2023-07-27 NOTE — Assessment & Plan Note (Signed)
 Has been sober and looking to establish for suboxone and treatment of opoid use disorder. Will evaluate resources.

## 2023-07-27 NOTE — Patient Instructions (Addendum)
 Nice to see you.  We will check your lab work today.  Continue to take your medication daily as prescribed.  Refills have been sent to the pharmacy.  Plan for follow up in 1 months or sooner if needed with lab work on the same day.   [facility] [address] [place], [place], [place] [address] [phone]  Mental Health Resources  988: can call or text 24/7  [facility]: Address: [address] [place], [place] [address] Open 24 hours Phone: [phone]  [facility] [place]: Address: [address] [place], [place] [address] Phone: [phone] Appointments: [email or web address]

## 2023-07-31 ENCOUNTER — Telehealth: Payer: Self-pay

## 2023-07-31 ENCOUNTER — Other Ambulatory Visit: Payer: Self-pay | Admitting: Family

## 2023-07-31 NOTE — Telephone Encounter (Signed)
 Patient called office to follow up on Gabapentin  refill. States she called [facility] and was told prescription was closed out and would need provider to send in a refill. Called [facility] and spoke with Aden Agreste, Pharmacist who states she will reopen script.  Julien Odor, RMA

## 2023-08-03 ENCOUNTER — Other Ambulatory Visit: Payer: Self-pay | Admitting: Pharmacist

## 2023-08-03 DIAGNOSIS — B20 Human immunodeficiency virus [HIV] disease: Secondary | ICD-10-CM

## 2023-08-03 MED ORDER — BICTEGRAVIR-EMTRICITAB-TENOFOV 50-200-25 MG PO TABS: 1.0000 | ORAL_TABLET | Freq: Every day | ORAL | Status: AC

## 2023-08-03 NOTE — Progress Notes (Signed)
 Medication Samples have been provided to the patient.  Drug name: Biktarvy         Strength: 50/200/25 mg       Qty: 7 tablets (1 bottles) LOT: CTGMDA   Exp.Date: 7/27  Samples requested by Marlan Silva, NP.  Dosing instructions: Take one tablet by mouth once daily  The patient has been instructed regarding the correct time, dose, and frequency of taking this medication, including desired effects and most common side effects.   Nicklas Barns, PharmD, CPP, BCIDP, AAHIVP Clinical Pharmacist Practitioner Infectious Diseases Clinical Pharmacist [facility] for Infectious Disease

## 2023-08-20 ENCOUNTER — Telehealth: Payer: Self-pay

## 2023-08-20 DIAGNOSIS — S22000S Wedge compression fracture of unspecified thoracic vertebra, sequela: Secondary | ICD-10-CM

## 2023-08-20 NOTE — Telephone Encounter (Signed)
 Patient called to schedule appointment and follow up on referral.   She was referred to pain management. Per referral note, will need new referral to [facility].  Carianne Taira, BSN, RN

## 2023-08-20 NOTE — Addendum Note (Signed)
 Addended by: Julyanna Scholle D on: 08/20/2023 03:04 PM   Modules accepted: Orders

## 2023-08-20 NOTE — Telephone Encounter (Signed)
 Referral placed.

## 2023-09-17 ENCOUNTER — Ambulatory Visit: Payer: MEDICAID | Admitting: Family

## 2023-09-25 ENCOUNTER — Other Ambulatory Visit: Payer: Self-pay

## 2023-09-25 ENCOUNTER — Other Ambulatory Visit (HOSPITAL_COMMUNITY): Payer: Self-pay

## 2023-09-25 ENCOUNTER — Other Ambulatory Visit: Payer: Self-pay | Admitting: Pharmacist

## 2023-09-25 MED ORDER — BIKTARVY 50-200-25 MG PO TABS
ORAL_TABLET | ORAL | 1 refills | Status: DC
  Filled 2023-09-26: qty 30, 30d supply, fill #0
  Filled 2023-11-07 – 2023-11-15 (×2): qty 30, 30d supply, fill #1

## 2023-09-25 NOTE — Progress Notes (Signed)
 Specialty Pharmacy Initiation Note   Cindy Robertson is a 33 y.o. female who will be followed by the specialty pharmacy service for RxSp HIV    Review of administration, indication, effectiveness, safety, potential side effects, storage/disposable, and missed dose instructions occurred today for patient's specialty medication(s) Bictegravir-Emtricitab-Tenofov (Biktarvy )     Patient/Caregiver did not have any additional questions or concerns.   Patient's therapy is appropriate to: Continue    Goals Addressed             This Visit's Progress    Achieve Undetectable HIV Viral Load < 20       Patient is on track. Patient will maintain adherence      Comply with lab assessments       Patient is on track. Patient will adhere to provider and/or lab appointments      Increase CD4 count until steady state       Patient is on track. Patient will maintain adherence      Maintain optimal adherence to therapy       Patient is not on track and improving. Patient will work on increased adherence         Alan JINNY Geralds Specialty Pharmacist

## 2023-09-25 NOTE — Progress Notes (Signed)
 Specialty Pharmacy Refill Coordination Note  Brian Kocourek is a 33 y.o. female contacted today regarding refills of specialty medication(s) Bictegravir-Emtricitab-Tenofov (Biktarvy )   Patient requested Delivery   Delivery date: 10/16/23   Verified address: [address] [place] [address]   Medication will be filled on 10/15/23.

## 2023-09-26 ENCOUNTER — Other Ambulatory Visit (HOSPITAL_COMMUNITY): Payer: Self-pay

## 2023-09-26 ENCOUNTER — Other Ambulatory Visit: Payer: Self-pay

## 2023-09-26 MED ORDER — DULOXETINE HCL 30 MG PO CPEP
30.0000 mg | ORAL_CAPSULE | Freq: Two times a day (BID) | ORAL | 3 refills | Status: DC
  Filled 2023-09-26 – 2023-10-15 (×2): qty 30, 15d supply, fill #0

## 2023-09-26 MED ORDER — GABAPENTIN 300 MG PO CAPS
300.0000 mg | ORAL_CAPSULE | Freq: Two times a day (BID) | ORAL | 1 refills | Status: DC
  Filled 2023-09-26: qty 60, 30d supply, fill #0
  Filled ????-??-??: fill #0

## 2023-09-26 MED ORDER — OMEPRAZOLE 40 MG PO CPDR
40.0000 mg | DELAYED_RELEASE_CAPSULE | Freq: Every day | ORAL | 3 refills | Status: DC
  Filled 2023-09-26 – 2023-10-15 (×2): qty 30, 30d supply, fill #0

## 2023-09-27 ENCOUNTER — Other Ambulatory Visit: Payer: Self-pay

## 2023-10-04 ENCOUNTER — Other Ambulatory Visit: Payer: Self-pay

## 2023-10-08 ENCOUNTER — Ambulatory Visit: Payer: MEDICAID | Admitting: Family

## 2023-10-15 ENCOUNTER — Other Ambulatory Visit (HOSPITAL_COMMUNITY): Payer: Self-pay

## 2023-10-15 ENCOUNTER — Other Ambulatory Visit: Payer: Self-pay

## 2023-10-15 ENCOUNTER — Ambulatory Visit: Payer: MEDICAID | Admitting: Family

## 2023-11-07 ENCOUNTER — Other Ambulatory Visit: Payer: Self-pay

## 2023-11-09 ENCOUNTER — Other Ambulatory Visit (HOSPITAL_COMMUNITY): Payer: Self-pay

## 2023-11-13 ENCOUNTER — Other Ambulatory Visit: Payer: Self-pay

## 2023-11-15 ENCOUNTER — Other Ambulatory Visit: Payer: Self-pay

## 2023-11-19 ENCOUNTER — Other Ambulatory Visit: Payer: Self-pay

## 2023-12-12 ENCOUNTER — Other Ambulatory Visit: Payer: Self-pay

## 2023-12-12 ENCOUNTER — Ambulatory Visit: Payer: MEDICAID | Admitting: Family

## 2023-12-12 ENCOUNTER — Other Ambulatory Visit: Payer: Self-pay | Admitting: Pharmacist

## 2023-12-12 ENCOUNTER — Ambulatory Visit: Payer: Self-pay

## 2023-12-12 ENCOUNTER — Encounter: Payer: Self-pay | Admitting: Family

## 2023-12-12 VITALS — BP 114/79 | HR 66 | Temp 98.5°F | Wt 132.0 lb

## 2023-12-12 DIAGNOSIS — Z21 Asymptomatic human immunodeficiency virus [HIV] infection status: Secondary | ICD-10-CM

## 2023-12-12 DIAGNOSIS — R110 Nausea: Secondary | ICD-10-CM

## 2023-12-12 DIAGNOSIS — Z0000 Encounter for general adult medical examination without abnormal findings: Secondary | ICD-10-CM

## 2023-12-12 DIAGNOSIS — F319 Bipolar disorder, unspecified: Secondary | ICD-10-CM | POA: Diagnosis not present

## 2023-12-12 DIAGNOSIS — K2100 Gastro-esophageal reflux disease with esophagitis, without bleeding: Secondary | ICD-10-CM

## 2023-12-12 DIAGNOSIS — F419 Anxiety disorder, unspecified: Secondary | ICD-10-CM

## 2023-12-12 DIAGNOSIS — M545 Low back pain, unspecified: Secondary | ICD-10-CM

## 2023-12-12 DIAGNOSIS — Z113 Encounter for screening for infections with a predominantly sexual mode of transmission: Secondary | ICD-10-CM | POA: Diagnosis not present

## 2023-12-12 DIAGNOSIS — Z23 Encounter for immunization: Secondary | ICD-10-CM | POA: Diagnosis not present

## 2023-12-12 DIAGNOSIS — F1910 Other psychoactive substance abuse, uncomplicated: Secondary | ICD-10-CM | POA: Diagnosis not present

## 2023-12-12 MED ORDER — ONDANSETRON 4 MG PO TBDP: 4.0000 mg | ORAL_TABLET | Freq: Three times a day (TID) | ORAL | 2 refills | Status: AC | PRN

## 2023-12-12 MED ORDER — DULOXETINE HCL 60 MG PO CPEP: 60.0000 mg | ORAL_CAPSULE | Freq: Every day | ORAL | 3 refills | Status: DC

## 2023-12-12 MED ORDER — BIKTARVY 50-200-25 MG PO TABS: 1.0000 | ORAL_TABLET | Freq: Every day | ORAL | 3 refills | Status: AC

## 2023-12-12 MED ORDER — BIKTARVY 50-200-25 MG PO TABS: 1.0000 | ORAL_TABLET | Freq: Every day | ORAL | Status: AC

## 2023-12-12 MED ORDER — HYDROXYZINE HCL 50 MG PO TABS: 50.0000 mg | ORAL_TABLET | Freq: Two times a day (BID) | ORAL | 1 refills | Status: DC | PRN

## 2023-12-12 MED ORDER — OMEPRAZOLE 40 MG PO CPDR: 40.0000 mg | DELAYED_RELEASE_CAPSULE | Freq: Every day | ORAL | 3 refills | Status: AC

## 2023-12-12 MED ORDER — GABAPENTIN 300 MG PO CAPS: 300.0000 mg | ORAL_CAPSULE | Freq: Two times a day (BID) | ORAL | 2 refills | Status: AC

## 2023-12-12 NOTE — Patient Instructions (Addendum)
 Nice to see you.  We will check your lab work today.  Continue to take your medication daily as prescribed.  Refills have been sent to the pharmacy.  Plan for follow up in 1 months or sooner if needed with lab work on the same day.  Have a great day and stay safe!  Mental Health Resources  988: can call or text 24/7  [facility]: Address: [address] [place], [place] [address] Open 24 hours Phone: [phone]  [facility] [place]: Address: [address] [place], [place] [address] Phone: [phone] Appointments: [email or web address]

## 2023-12-12 NOTE — Progress Notes (Unsigned)
 Brief Narrative   Patient ID: Cindy Robertson, female    DOB: May 21, 1990, 33 y.o.   MRN: [id number]  Cindy Robertson is a 33 year old Caucasian female with HIV disease diagnosed in May 2021.  Initial CD4 count of 306 and viral load of 165,000.  Risk factors for acquiring HIV includes sexual contact and substance use.  Enters care at [facility] stage II.  Geno sure with no significant medication resistance patterns.  No history of opportunistic infection.  Sole medication regimen of Biktarvy .   Subjective:   Chief Complaint  Patient presents with   Follow-up    Flu shot today/ Covid shot today    HPI:  Cindy Robertson is a 33 y.o. female with HIV disease last seen on 07/27/2023 with adequately controlled virus and good adherence and tolerance to Biktarvy .  She was incarcerated at the time and had no problems obtaining medication.  Viral load was unable to be determined secondary to inability to get blood despite multiple attempts.  Care today for routine follow-up.  Cindy Robertson continues to take Biktarvy  as prescribed with no adverse side effects or problems obtaining medication.  She is covered by [employer] for today's visit however her Medicaid is currently on hold and is working to reactivate it.  Continues to use heroin and methamphetamine.  Currently in pain management for her back pain.  Also taking Cymbalta  and gabapentin  to help stabilize her mood.  No suicidal ideations or delusions/hallucinations.  Housing remains a problem and she is currently bouncing around from place to place with limited access to food and transportation.  Not currently sexually active.  Healthcare maintenance reviewed.  Denies fevers, chills, night sweats, headaches, changes in vision, neck pain/stiffness, nausea, diarrhea, vomiting, lesions or rashes.  Lab Results  Component Value Date   CD4TCELL 33 12/12/2023   CD4TABS 481 12/12/2023   Lab Results  Component Value Date   HIV1RNAQUANT <20 (H) 06/08/2022      Allergies  Allergen Reactions   Codeine Other (See Comments), Shortness Of Breath, Nausea Only and Palpitations    Per pt: makes her feel like she's going to have a heart attack   Sumatriptan-Naproxen Sodium Other (See Comments), Shortness Of Breath and Anaphylaxis    Causes tightness in chest Patient reports that she has taken ibuprofen before with no problems Increased headache   Tramadol Hives, Itching, Rash, Shortness Of Breath, Dermatitis and Other (See Comments)    headache    Bacitracin-Polymyxin B Dermatitis   Other    Sulfamethoxazole-Trimethoprim Other (See Comments)    Per pt: makes her blood pressure plummet    Benzalkonium Chloride Rash      Outpatient Medications Prior to Visit  Medication Sig Dispense Refill   famotidine (PEPCID) 20 MG tablet Take 20 mg by mouth 2 (two) times daily.     bictegravir-emtricitabine -tenofovir  AF (BIKTARVY ) 50-200-25 MG TABS tablet Take 1 tablet by mouth daily. 30 tablet 3   bictegravir-emtricitabine -tenofovir  AF (BIKTARVY ) 50-200-25 MG TABS tablet Take one tablet by mouth daily. 30 tablet 1   DULoxetine  (CYMBALTA ) 30 MG capsule TAKE 1 CAPSULE BY MOUTH TWICE DAILY (Patient taking differently: Take 60 mg by mouth 2 (two) times daily.) 60 capsule 0   DULoxetine  (CYMBALTA ) 30 MG capsule Take 1 capsule (30 mg total) by mouth 2 (two) times daily. 30 capsule 3   gabapentin  (NEURONTIN ) 300 MG capsule Take 1 capsule (300 mg total) by mouth 2 (two) times daily. 60 capsule 1   gabapentin  (NEURONTIN ) 300 MG capsule  Take 1 capsule (300 mg total) by mouth 2 (two) times daily. 60 capsule 1   hydrOXYzine  (ATARAX ) 50 MG tablet Take 1 tablet (50 mg total) by mouth 3 (three) times daily as needed. NEED APPOINTMENT FOR FUTURE REFILLS. 90 tablet 0   omeprazole  (PRILOSEC) 40 MG capsule Take 1 capsule (40 mg total) by mouth daily. 30 capsule 3   omeprazole  (PRILOSEC) 40 MG capsule Take 1 capsule (40 mg total) by mouth daily. 30 capsule 3   ondansetron   (ZOFRAN -ODT) 4 MG disintegrating tablet Take 1 tablet (4 mg total) by mouth every 8 (eight) hours as needed for nausea or vomiting. 30 tablet 2   No facility-administered medications prior to visit.     Past Medical History:  Diagnosis Date   Depression    Hepatitis B    Hepatitis C    HIV (human immunodeficiency virus infection) (HCC)      History reviewed. No pertinent surgical history.      Review of Systems  Constitutional:  Negative for appetite change, chills, diaphoresis, fatigue, fever and unexpected weight change.  Eyes:        Negative for acute change in vision  Respiratory:  Negative for chest tightness, shortness of breath and wheezing.   Cardiovascular:  Negative for chest pain.  Gastrointestinal:  Negative for diarrhea, nausea and vomiting.  Genitourinary:  Negative for dysuria, pelvic pain and vaginal discharge.  Musculoskeletal:  Negative for neck pain and neck stiffness.  Skin:  Negative for rash.  Neurological:  Negative for seizures, syncope, weakness and headaches.  Hematological:  Negative for adenopathy. Does not bruise/bleed easily.  Psychiatric/Behavioral:  Negative for hallucinations.      Objective:   BP 114/79   Pulse 66   Temp 98.5 F (36.9 C) (Oral)   Wt 132 lb (59.9 kg)   SpO2 97%   BMI 24.14 kg/m  Nursing note and vital signs reviewed.  Physical Exam Constitutional:      General: She is not in acute distress.    Appearance: She is well-developed.  Eyes:     Conjunctiva/sclera: Conjunctivae normal.  Cardiovascular:     Rate and Rhythm: Normal rate and regular rhythm.     Heart sounds: Normal heart sounds. No murmur heard.    No friction rub. No gallop.  Pulmonary:     Effort: Pulmonary effort is normal. No respiratory distress.     Breath sounds: Normal breath sounds. No wheezing or rales.  Chest:     Chest wall: No tenderness.  Abdominal:     General: Bowel sounds are normal.     Palpations: Abdomen is soft.      Tenderness: There is no abdominal tenderness.  Musculoskeletal:     Cervical back: Neck supple.  Lymphadenopathy:     Cervical: No cervical adenopathy.  Skin:    General: Skin is warm and dry.     Findings: No rash.  Neurological:     Mental Status: She is alert and oriented to person, place, and time.          12/12/2023    1:45 PM 10/14/2021   10:27 AM 09/13/2021    9:40 AM 01/13/2021    3:26 PM 06/01/2020    3:46 PM  Depression screen PHQ 2/9  Decreased Interest 2 0 0 2 0  Down, Depressed, Hopeless 2 0 1 1 0  PHQ - 2 Score 4 0 1 3 0  Altered sleeping 2   2   Tired, decreased energy 2  3   Change in appetite 2   2   Feeling bad or failure about yourself  2   3   Trouble concentrating 2   3   Moving slowly or fidgety/restless 2   0   Suicidal thoughts 2   0   PHQ-9 Score 18   16         12/12/2023    1:45 PM  GAD 7 : Generalized Anxiety Score  Nervous, Anxious, on Edge 2  Control/stop worrying 2  Worry too much - different things 2  Trouble relaxing 2  Restless 2  Easily annoyed or irritable 2  Afraid - awful might happen 2  Total GAD 7 Score 14     The ASCVD Risk score (Arnett DK, et al., 2019) failed to calculate for the following reasons:   The 2019 ASCVD risk score is only valid for ages 1 to 79      Assessment & Plan:    Patient Active Problem List   Diagnosis Date Noted   Compression fx, thoracic spine, sequela 01/13/2021   Bipolar 1 disorder (HCC) 01/13/2021   Chronic pain of left knee 01/13/2021   Healthcare maintenance 01/13/2021   Gastroesophageal reflux disease with esophagitis without hemorrhage 07/02/2020   Acute bilateral low back pain without sciatica 07/02/2020   Chronic hepatitis C without hepatic coma (HCC) 07/24/2019   Hepatitis B surface antigen positive 07/24/2019   Depression 07/24/2019   HIV (human immunodeficiency virus infection) (HCC) 06/18/2019   Drug abuse (HCC) 06/18/2019   Toxic metabolic encephalopathy 06/17/2019      Problem List Items Addressed This Visit       Digestive   Gastroesophageal reflux disease with esophagitis without hemorrhage   Relevant Medications   omeprazole  (PRILOSEC) 40 MG capsule     Other   HIV (human immunodeficiency virus infection) (HCC)   Cindy Robertson appears to have adequately controlled virus with good adherence and tolerance to Biktarvy .  Reviewed previous lab work and discussed plan of care and U equals U.  Financial coverage is on hold awaiting Medicare reactivation and is covered by [employer] for today's visit.  Sample of medication provided to bridge to reactivation of Medicaid.  Social determinants of health reviewed and will connect with resources.  Phlebotomy unable to obtain blood work and will attempt again in the future.  Continue current dose of Biktarvy .  Plan for follow-up in 1 m.  Onth or sooner if needed with lab work on the same day      Relevant Medications   bictegravir-emtricitabine -tenofovir  AF (BIKTARVY ) 50-200-25 MG TABS tablet   Other Relevant Orders   T-helper cell (CD4)- ([facility] clinic only) (Completed)   HIV-1 RNA quant-no reflex-bld   Comprehensive metabolic panel with GFR   Drug abuse (HCC)   Continues to use methamphetamine and heroin.  This is negatively contributing to her social determinants of health.  Discussed cessation with counseling resources provided in after visit summary.      Acute bilateral low back pain without sciatica   Established with pain management.      Bipolar 1 disorder (HCC)   Previously diagnosed with bipolar 1 disorder and currently on Cymbalta  and gabapentin .  May need further mood stabilization with consideration for Lamictal or valproic acid.  There is a slight chance her duloxetine  may push her into a manic episode if her mood is not stabilized.  For now continue current dose of Cymbalta .  Ideally will connect with psychiatry for further management.  Healthcare maintenance   Not currently sexually  active. Vaccinations reviewed with influenza and COVID vaccines updated. Due for routine dental care. Due for cervical cancer screening with encouragement for GYN or Pap clinic.      Other Visit Diagnoses       Screening for STDs (sexually transmitted diseases)    -  Primary   Relevant Orders   RPR     Anxiety       Relevant Medications   hydrOXYzine  (ATARAX ) 50 MG tablet   DULoxetine  (CYMBALTA ) 60 MG capsule     Nausea       Relevant Medications   ondansetron  (ZOFRAN -ODT) 4 MG disintegrating tablet     Need for influenza vaccination       Relevant Orders   Flu vaccine trivalent PF, 6mos and older(Flulaval,Afluria,Fluarix,Fluzone) (Completed)     Encounter for immunization       Relevant Orders   Pfizer Comirnaty Covid-19 Vaccine 63yrs & older (Completed)        I have discontinued Nat Marina Cole's DULoxetine  and DULoxetine . I have also changed her hydrOXYzine . Additionally, I am having her start on DULoxetine . Lastly, I am having her maintain her famotidine, Biktarvy , gabapentin , omeprazole , and ondansetron .   Meds ordered this encounter  Medications   bictegravir-emtricitabine -tenofovir  AF (BIKTARVY ) 50-200-25 MG TABS tablet    Sig: Take 1 tablet by mouth daily.    Dispense:  30 tablet    Refill:  3    Please fill on 07/31/23 - has [employer]    Supervising Provider:   LUIZ CHANNEL [phone]    Prescription Type::   Renewal   gabapentin  (NEURONTIN ) 300 MG capsule    Sig: Take 1 capsule (300 mg total) by mouth 2 (two) times daily.    Dispense:  60 capsule    Refill:  2    Please fill on 07/2723    Supervising Provider:   SNIDER, CYNTHIA [[id number]]   hydrOXYzine  (ATARAX ) 50 MG tablet    Sig: Take 1 tablet (50 mg total) by mouth every 12 (twelve) hours as needed.    Dispense:  60 tablet    Refill:  1    Supervising Provider:   SNIDER, CYNTHIA [[id number]]   omeprazole  (PRILOSEC) 40 MG capsule    Sig: Take 1 capsule (40 mg total) by mouth daily.    Dispense:  30  capsule    Refill:  3    Supervising Provider:   SNIDER, CYNTHIA [[id number]]   ondansetron  (ZOFRAN -ODT) 4 MG disintegrating tablet    Sig: Take 1 tablet (4 mg total) by mouth every 8 (eight) hours as needed for nausea or vomiting.    Dispense:  30 tablet    Refill:  2    Please fill when UMAP approved.    Supervising Provider:   SNIDER, CYNTHIA [[id number]]   DULoxetine  (CYMBALTA ) 60 MG capsule    Sig: Take 1 capsule (60 mg total) by mouth daily.    Dispense:  30 capsule    Refill:  3    Supervising Provider:   LUIZ CHANNEL [[id number]]     Follow-up: Return in about 1 month (around 01/12/2024). or sooner if needed.    Cathlyn July, MSN, FNP-C Nurse Practitioner [facility] for Infectious Disease [facility] Main number: [phone]

## 2023-12-12 NOTE — Progress Notes (Signed)
 Medication Samples have been provided to the patient.  Drug name: Biktarvy         Strength: 50/200/25 mg       Qty: 1 bottle (7 tablets)   LOT: [id number]   Exp.Date: 08/2025    Samples requested by Cathlyn July, ID NP.  Dosing instructions: Take one tablet by mouth once daily  The patient has been instructed regarding the correct time, dose, and frequency of taking this medication, including desired effects and most common side effects.   Jefte Carithers L. Lorma Heater, PharmD, BCIDP, AAHIVP, CPP Clinical Pharmacist Practitioner Infectious Diseases Clinical Pharmacist [facility]

## 2023-12-13 LAB — T-HELPER CELL (CD4) - (RCID CLINIC ONLY)
CD4 % Helper T Cell: 33 % (ref 33–65)
CD4 T Cell Abs: 481 /uL (ref 400–1790)

## 2023-12-13 NOTE — Assessment & Plan Note (Signed)
 Not currently sexually active. Vaccinations reviewed with influenza and COVID vaccines updated. Due for routine dental care. Due for cervical cancer screening with encouragement for GYN or Pap clinic.

## 2023-12-13 NOTE — Assessment & Plan Note (Signed)
 Ms. Kirchman appears to have adequately controlled virus with good adherence and tolerance to Biktarvy .  Reviewed previous lab work and discussed plan of care and U equals U.  Financial coverage is on hold awaiting Medicare reactivation and is covered by [employer] for today's visit.  Sample of medication provided to bridge to reactivation of Medicaid.  Social determinants of health reviewed and will connect with resources.  Phlebotomy unable to obtain blood work and will attempt again in the future.  Continue current dose of Biktarvy .  Plan for follow-up in 1 m.  Onth or sooner if needed with lab work on the same day

## 2023-12-13 NOTE — Assessment & Plan Note (Signed)
 Continues to use methamphetamine and heroin.  This is negatively contributing to her social determinants of health.  Discussed cessation with counseling resources provided in after visit summary.

## 2023-12-13 NOTE — Assessment & Plan Note (Signed)
 Established with pain management

## 2023-12-13 NOTE — Assessment & Plan Note (Signed)
 Previously diagnosed with bipolar 1 disorder and currently on Cymbalta  and gabapentin .  May need further mood stabilization with consideration for Lamictal or valproic acid.  There is a slight chance her duloxetine  may push her into a manic episode if her mood is not stabilized.  For now continue current dose of Cymbalta .  Ideally will connect with psychiatry for further management.

## 2023-12-25 ENCOUNTER — Telehealth: Payer: Self-pay

## 2023-12-25 NOTE — Telephone Encounter (Signed)
 Patient called requesting lab results.     Cindy Robertson Lesli Albee, CMA

## 2024-01-03 ENCOUNTER — Ambulatory Visit (INDEPENDENT_AMBULATORY_CARE_PROVIDER_SITE_OTHER): Admitting: Family

## 2024-01-03 ENCOUNTER — Other Ambulatory Visit: Payer: Self-pay

## 2024-01-03 ENCOUNTER — Encounter: Payer: Self-pay | Admitting: Family

## 2024-01-03 ENCOUNTER — Other Ambulatory Visit (HOSPITAL_COMMUNITY): Payer: Self-pay

## 2024-01-03 VITALS — BP 113/78 | HR 70 | Temp 98.8°F | Ht 62.0 in | Wt 122.0 lb

## 2024-01-03 DIAGNOSIS — Z21 Asymptomatic human immunodeficiency virus [HIV] infection status: Secondary | ICD-10-CM | POA: Diagnosis present

## 2024-01-03 DIAGNOSIS — F419 Anxiety disorder, unspecified: Secondary | ICD-10-CM

## 2024-01-03 DIAGNOSIS — F319 Bipolar disorder, unspecified: Secondary | ICD-10-CM | POA: Diagnosis not present

## 2024-01-03 DIAGNOSIS — K047 Periapical abscess without sinus: Secondary | ICD-10-CM | POA: Diagnosis not present

## 2024-01-03 MED ORDER — HYDROXYZINE HCL 50 MG PO TABS
50.0000 mg | ORAL_TABLET | Freq: Two times a day (BID) | ORAL | 1 refills | Status: AC | PRN
  Filled 2024-01-03 (×2): qty 60, 30d supply, fill #0

## 2024-01-03 MED ORDER — ARIPIPRAZOLE 5 MG PO TABS
5.0000 mg | ORAL_TABLET | Freq: Every day | ORAL | 2 refills | Status: DC
  Filled 2024-01-03 (×2): qty 30, 30d supply, fill #0

## 2024-01-03 MED ORDER — AMOXICILLIN-POT CLAVULANATE 875-125 MG PO TABS
1.0000 | ORAL_TABLET | Freq: Two times a day (BID) | ORAL | 0 refills | Status: AC
  Filled 2024-01-03 (×2): qty 20, 10d supply, fill #0

## 2024-01-03 NOTE — Assessment & Plan Note (Signed)
 Symptoms consistent with dental infection and will start Augmentin to bridge to dental appointment.

## 2024-01-03 NOTE — Assessment & Plan Note (Addendum)
 No evidence of mania. Has had some irritability recently. Discussed goals of maintenance and avoiding highs and lows. Following discussion will start aripiprazole 5 mg daily and titrate up as she is on duloxetine  as well. No suicidal ideation or signs of psychosis. Continue current dose of duloxetine  and hydroxyzine  as needed for anxiety. Drug use continues to play a role as well.

## 2024-01-03 NOTE — Assessment & Plan Note (Signed)
 Lokelani continues to have good adherence and tolerance to Biktarvy  with no adverse side effects. Reviewed previous lab work and U equals U. Check remaining lab work today. Continue current dose of Biktarvy . Plan for follow up in 1 month or sooner if needed.

## 2024-01-03 NOTE — Progress Notes (Signed)
 Brief Narrative   Patient ID: Cindy Robertson, female    DOB: [birth year], 33 y.o.   MRN: [id number]  Cindy Robertson is a 33 year old Caucasian female with HIV disease diagnosed in May 2021.  Initial CD4 count of 306 and viral load of 165,000.  Risk factors for acquiring HIV includes sexual contact and substance use.  Enters care at [facility] stage II.  Geno sure with no significant medication resistance patterns.  No history of opportunistic infection.  Sole medication regimen of Biktarvy .   Subjective:   Chief Complaint  Patient presents with   Follow-up    Reports tooth pain x 1 week    HPI:  Cindy Robertson is a 33 y.o. female with HIV disease last seen on 12/12/23 with good adherence and tolerance to Biktarvy . Limited blood work was able to be obtained with CD4 count being 481. Here today for follow up.  Cindy Robertson has been doing okay since last office visit and continues to take Biktarvy  as prescribed with no adverse side effects. Believes medicaid should be active now and no problems obtaining medication from the pharmacy. Concerned for a dental abscess and working on getting into dentistry. Mood has been okay and using the hydroxyzine  as needed for anxiety and needing a refill. Cymbalta  is helping with Cindy Robertson pain.and is working on getting into mental health. Continues to work with case management.   Denies fevers, chills, night sweats, headaches, changes in vision, neck pain/stiffness, nausea, diarrhea, vomiting, lesions or rashes.  Lab Results  Component Value Date   CD4TCELL 33 12/12/2023   CD4TABS 481 12/12/2023   Lab Results  Component Value Date   HIV1RNAQUANT <20 (H) 06/08/2022     Allergies  Allergen Reactions   Codeine Other (See Comments), Shortness Of Breath, Nausea Only and Palpitations    Per pt: makes Cindy Robertson feel like she's going to have a heart attack   Sumatriptan-Naproxen Sodium Other (See Comments), Shortness Of Breath and Anaphylaxis    Causes tightness in  chest Patient reports that she has taken ibuprofen before with no problems Increased headache   Tramadol Hives, Itching, Rash, Shortness Of Breath, Dermatitis and Other (See Comments)    headache    Bacitracin-Polymyxin B Dermatitis   Other    Sulfamethoxazole-Trimethoprim Other (See Comments)    Per pt: makes Cindy Robertson blood pressure plummet    Benzalkonium Chloride Rash      Outpatient Medications Prior to Visit  Medication Sig Dispense Refill   bictegravir-emtricitabine -tenofovir  AF (BIKTARVY ) 50-200-25 MG TABS tablet Take 1 tablet by mouth daily. 30 tablet 3   DULoxetine  (CYMBALTA ) 60 MG capsule Take 1 capsule (60 mg total) by mouth daily. 30 capsule 3   famotidine (PEPCID) 20 MG tablet Take 20 mg by mouth 2 (two) times daily.     gabapentin  (NEURONTIN ) 300 MG capsule Take 1 capsule (300 mg total) by mouth 2 (two) times daily. 60 capsule 2   omeprazole  (PRILOSEC) 40 MG capsule Take 1 capsule (40 mg total) by mouth daily. 30 capsule 3   ondansetron  (ZOFRAN -ODT) 4 MG disintegrating tablet Take 1 tablet (4 mg total) by mouth every 8 (eight) hours as needed for nausea or vomiting. 30 tablet 2   hydrOXYzine  (ATARAX ) 50 MG tablet Take 1 tablet (50 mg total) by mouth every 12 (twelve) hours as needed. (Patient not taking: Reported on 01/03/2024) 60 tablet 1   No facility-administered medications prior to visit.     Past Medical History:  Diagnosis Date   Depression  Hepatitis B    Hepatitis C    HIV (human immunodeficiency virus infection) (HCC)      History reviewed. No pertinent surgical history.      Review of Systems   Objective:   BP 113/78   Pulse 70   Temp 98.8 F (37.1 C) (Oral)   Ht 5' 2 (1.575 m)   Wt 122 lb (55.3 kg)   SpO2 95%   BMI 22.31 kg/m  Nursing note and vital signs reviewed.  Physical Exam       12/12/2023    1:45 PM 10/14/2021   10:27 AM 09/13/2021    9:40 AM 01/13/2021    3:26 PM 06/01/2020    3:46 PM  Depression screen PHQ 2/9   Decreased Interest 2 0 0 2 0  Down, Depressed, Hopeless 2 0 1 1 0  PHQ - 2 Score 4 0 1 3 0  Altered sleeping 2   2   Tired, decreased energy 2   3   Change in appetite 2   2   Feeling bad or failure about yourself  2   3   Trouble concentrating 2   3   Moving slowly or fidgety/restless 2   0   Suicidal thoughts 2   0   PHQ-9 Score 18   16         12/12/2023    1:45 PM  GAD 7 : Generalized Anxiety Score  Nervous, Anxious, on Edge 2  Control/stop worrying 2  Worry too much - different things 2  Trouble relaxing 2  Restless 2  Easily annoyed or irritable 2  Afraid - awful might happen 2  Total GAD 7 Score 14     The ASCVD Risk score (Arnett DK, et al., 2019) failed to calculate for the following reasons:   The 2019 ASCVD risk score is only valid for ages 75 to 4      Assessment & Plan:    Patient Active Problem List   Diagnosis Date Noted   Dental infection 01/03/2024   Compression fx, thoracic spine, sequela 01/13/2021   Bipolar 1 disorder (HCC) 01/13/2021   Chronic pain of left knee 01/13/2021   Healthcare maintenance 01/13/2021   Gastroesophageal reflux disease with esophagitis without hemorrhage 07/02/2020   Acute bilateral low back pain without sciatica 07/02/2020   Chronic hepatitis C without hepatic coma (HCC) 07/24/2019   Hepatitis B surface antigen positive 07/24/2019   Depression 07/24/2019   HIV (human immunodeficiency virus infection) (HCC) 06/18/2019   Drug abuse (HCC) 06/18/2019   Toxic metabolic encephalopathy 06/17/2019     Problem List Items Addressed This Visit       Digestive   Dental infection   Symptoms consistent with dental infection and will start Augmentin to bridge to dental appointment.         Other   HIV (human immunodeficiency virus infection) (HCC) - Primary   Cindy Robertson continues to have good adherence and tolerance to Biktarvy  with no adverse side effects. Reviewed previous lab work and U equals U. Check remaining lab work  today. Continue current dose of Biktarvy . Plan for follow up in 1 month or sooner if needed.       Bipolar 1 disorder (HCC)   No evidence of mania. Has had some irritability recently. Discussed goals of maintenance and avoiding highs and lows. Following discussion will start aripiprazole 5 mg daily and titrate up as she is on duloxetine  as well. No suicidal ideation or signs of  psychosis. Continue current dose of duloxetine  and hydroxyzine  as needed for anxiety. Drug use continues to play a role as well.       Other Visit Diagnoses       Anxiety       Relevant Medications   hydrOXYzine  (ATARAX ) 50 MG tablet        I am having Nat Casimir Hoit start on amoxicillin-clavulanate and ARIPiprazole. I am also having Cindy Robertson maintain Cindy Robertson famotidine, Biktarvy , gabapentin , omeprazole , ondansetron , DULoxetine , and hydrOXYzine .   Meds ordered this encounter  Medications   amoxicillin-clavulanate (AUGMENTIN) 875-125 MG tablet    Sig: Take 1 tablet by mouth 2 (two) times daily.    Dispense:  20 tablet    Refill:  0    Supervising Provider:   SNIDER, CYNTHIA [[id number]]   hydrOXYzine  (ATARAX ) 50 MG tablet    Sig: Take 1 tablet (50 mg total) by mouth every 12 (twelve) hours as needed.    Dispense:  60 tablet    Refill:  1    Please mail    Supervising Provider:   SNIDER, CYNTHIA [[id number]]   ARIPiprazole (ABILIFY) 5 MG tablet    Sig: Take 1 tablet (5 mg total) by mouth daily.    Dispense:  30 tablet    Refill:  2    Supervising Provider:   LUIZ CHANNEL [[id number]]     Follow-up: Return in about 1 month (around 02/03/2024). or sooner if needed.    Cathlyn July, MSN, FNP-C Nurse Practitioner [facility] for Infectious Disease [facility] Main number: [phone]

## 2024-01-03 NOTE — Patient Instructions (Addendum)
 Nice to see you.  We will check your lab work today.  Continue to take your medication daily as prescribed.  Refills have been sent to the pharmacy.  Plan for follow up in 1 months or sooner if needed with lab work on the same day.  Have a great day and stay safe!   Smoking Cessation: QuitlineNC 1-800-QUIT-NOW 267-601-5207); Espaol: 1-855-Djelo-Ya (1-6282702540) [email or web address]

## 2024-02-04 ENCOUNTER — Ambulatory Visit: Admitting: Family

## 2024-02-05 ENCOUNTER — Other Ambulatory Visit (HOSPITAL_COMMUNITY): Payer: Self-pay

## 2024-02-05 ENCOUNTER — Telehealth: Payer: Self-pay

## 2024-02-05 NOTE — Telephone Encounter (Signed)
 Patient called stating she need to change address and needed refills. Informed her that she will need to call [facility] to update address and receive refills. Went ahead and updated the address on my end. That address is a shelter and is where the patient is currently located. Patient also stated that she need to change her appointment and was transferred to the front to get this matter handled.

## 2024-03-04 ENCOUNTER — Ambulatory Visit: Admitting: Family

## 2024-03-24 ENCOUNTER — Other Ambulatory Visit: Payer: Self-pay

## 2024-03-24 MED ORDER — DULOXETINE HCL 60 MG PO CPEP: 60.0000 mg | ORAL_CAPSULE | Freq: Every day | ORAL | 0 refills | Status: AC

## 2024-03-24 MED ORDER — ARIPIPRAZOLE 5 MG PO TABS: 5.0000 mg | ORAL_TABLET | Freq: Every day | ORAL | 0 refills | Status: AC

## 2024-03-25 ENCOUNTER — Ambulatory Visit: Admitting: Family
